# Patient Record
Sex: Female | Born: 1975 | Race: White | Hispanic: No | Smoking: Current every day smoker
Health system: Southern US, Community
[De-identification: ages and names within clinical notes are randomized; demographics above are authoritative.]

## PROBLEM LIST (undated history)

## (undated) DIAGNOSIS — N939 Abnormal uterine and vaginal bleeding, unspecified: Secondary | ICD-10-CM

## (undated) DIAGNOSIS — Z789 Other specified health status: Secondary | ICD-10-CM

## (undated) DIAGNOSIS — N83209 Unspecified ovarian cyst, unspecified side: Secondary | ICD-10-CM

## (undated) HISTORY — DX: Unspecified ovarian cyst, unspecified side: N83.209

## (undated) HISTORY — DX: Other specified health status: Z78.9

## (undated) HISTORY — PX: WISDOM TOOTH EXTRACTION: SHX21

## (undated) HISTORY — PX: DILATION AND CURETTAGE OF UTERUS: SHX78

## (undated) HISTORY — DX: Abnormal uterine and vaginal bleeding, unspecified: N93.9

---

## 2007-06-09 ENCOUNTER — Inpatient Hospital Stay: Payer: Self-pay | Admitting: Unknown Physician Specialty

## 2007-08-21 ENCOUNTER — Ambulatory Visit: Payer: Self-pay | Admitting: Unknown Physician Specialty

## 2007-08-22 ENCOUNTER — Ambulatory Visit: Payer: Self-pay | Admitting: Unknown Physician Specialty

## 2009-04-08 ENCOUNTER — Inpatient Hospital Stay: Payer: Self-pay

## 2021-06-15 ENCOUNTER — Ambulatory Visit: Payer: Medicaid Other | Admitting: Family Medicine

## 2021-06-15 ENCOUNTER — Encounter: Payer: Self-pay | Admitting: Family Medicine

## 2021-06-15 VITALS — BP 130/90 | Ht 69.0 in | Wt 201.0 lb

## 2021-06-15 DIAGNOSIS — N939 Abnormal uterine and vaginal bleeding, unspecified: Secondary | ICD-10-CM | POA: Diagnosis not present

## 2021-06-15 DIAGNOSIS — T8332XA Displacement of intrauterine contraceptive device, initial encounter: Secondary | ICD-10-CM | POA: Diagnosis not present

## 2021-06-15 MED ORDER — MEGESTROL ACETATE 40 MG PO TABS: ORAL_TABLET | ORAL | 3 refills | Status: DC

## 2021-06-15 NOTE — Progress Notes (Signed)
? ?  AUB VISIT ENCOUNTER NOTE ? ?Subjective:  ? Heather Mccann is a 46 y.o. [id number] female here for reproductive life counseling. The patient is currently using IUD or IUS to prevent pregnancy.   ? ?The patient does not want a pregnancy in the next year.   ? ?Client states they are looking for the following:  Cycle control. Reports she had AUB for months since IUD placement. The IUD placed to help with AUB but has not.  ? ?Denies abnormal  discharge, pelvic pain, problems with intercourse or other gynecologic concerns.  ?  ?Gynecologic History ?No LMP recorded (lmp unknown). ? ?Health Maintenance Due  ?Topic Date Due  ? COVID-19 Vaccine (1) Never done  ? HIV Screening  Never done  ? Hepatitis C Screening  Never done  ? TETANUS/TDAP  Never done  ? PAP SMEAR-Modifier  Never done  ? ? ? ?The following portions of the patient's history were reviewed and updated as appropriate: allergies, current medications, past family history, past medical history, past social history, past surgical history and problem list. ? ?Review of Systems ?Pertinent items are noted in HPI. ?  ?Objective:  ?BP 130/90   Ht 5\' 9"  (1.753 m)   Wt 201 lb (91.2 kg)   LMP  (LMP Unknown)   BMI 29.68 kg/m?  ?Gen: well appearing, NAD ?HEENT: no scleral icterus ?CV: RR ?Lung: Normal WOB ?Ext: warm well perfused ? ?Assessment and Plan:  ? ?1. Abnormal uterine bleeding (AUB) ?Currently on cycle ?Discussed bleeding continue and then with follow up with GYN surgeon to discuss ablation. Patient also need TVUS to assess for fibroids in terms of operative planning.  ?Patient is not a good candidate for estrogen containing methods due to heavy smoking and age > 66 ?After discussion about plan of care patient wants to keep IUD until 33 and appt with GYn surgeon. There might be the possibility of removal at time of ablation.  ?- megestrol (MEGACE) 40 MG tablet; Take two tablets three times daily for 3 days, then two tablets twice daily for 3 days, then one  tablet twice daily  Dispense: 90 tablet; Refill: 3 ?- US PELVIS (TRANSABDOMINAL ONLY); Future ? ? ?Please refer to After Visit Summary for other counseling recommendations.  ? ?Return in about 4 weeks (around 07/13/2021) for with GYN surgeon -- discuss ablation/pre-op and 07/15/2021 results. ? ?Future Appointments  ?Date Time Provider Department Center  ?06/24/2021  3:30 PM [facility]  ?07/15/2021  8:55 AM Constant, 07/17/2021, MD [facility] None  ? ? ?Gigi Gin, MD ? ?

## 2021-06-19 ENCOUNTER — Encounter: Payer: Self-pay | Admitting: Family Medicine

## 2021-06-24 ENCOUNTER — Ambulatory Visit
Admission: RE | Admit: 2021-06-24 | Discharge: 2021-06-24 | Disposition: A | Discharge status: Home / Self Care | Payer: Medicaid Other | Source: Ambulatory Visit | Attending: Family Medicine | Admitting: Family Medicine

## 2021-06-24 DIAGNOSIS — N939 Abnormal uterine and vaginal bleeding, unspecified: Secondary | ICD-10-CM | POA: Diagnosis present

## 2021-06-26 ENCOUNTER — Encounter: Payer: Self-pay | Admitting: Family Medicine

## 2021-06-26 MED ORDER — SLYND 4 MG PO TABS: 1.0000 | ORAL_TABLET | Freq: Every day | ORAL | 11 refills | Status: DC

## 2021-06-26 NOTE — Addendum Note (Signed)
Addended by: Geanie Berlin on: 06/26/2021 01:24 PM ? ? Modules accepted: Orders ? ?

## 2021-07-08 ENCOUNTER — Ambulatory Visit
Admission: RE | Admit: 2021-07-08 | Discharge: 2021-07-08 | Disposition: A | Discharge status: Home / Self Care | Payer: Medicaid Other | Source: Ambulatory Visit | Attending: Family Medicine | Admitting: Family Medicine

## 2021-07-08 DIAGNOSIS — T8332XA Displacement of intrauterine contraceptive device, initial encounter: Secondary | ICD-10-CM | POA: Diagnosis present

## 2021-07-08 DIAGNOSIS — N939 Abnormal uterine and vaginal bleeding, unspecified: Secondary | ICD-10-CM | POA: Insufficient documentation

## 2021-07-15 ENCOUNTER — Ambulatory Visit: Payer: Medicaid Other | Admitting: Obstetrics and Gynecology

## 2021-07-15 ENCOUNTER — Encounter: Payer: Self-pay | Admitting: Obstetrics and Gynecology

## 2021-07-15 VITALS — BP 126/80 | Ht 69.0 in | Wt 194.0 lb

## 2021-07-15 DIAGNOSIS — N920 Excessive and frequent menstruation with regular cycle: Secondary | ICD-10-CM | POA: Diagnosis not present

## 2021-07-15 NOTE — Progress Notes (Signed)
46 yo P2 with BMI 28 following up for management of menorrhagia. Patient reports a monthly period lasting 7 days associated with heavy flow and passage of clots. She had a Mirena IUD placed 1 years ago and reports no improvement in her vaginal bleeding. Patient had a pelvic ultrasound and abdominal x-ray demonstrating the absence of an IUD. Patient reports improvement in her vaginal bleeding following megace taper and initiation of slynd birth control. Patient presents today wanting to discuss endometrial ablation.   No past medical history on file. No past surgical history on file. No family history on file. Social History   Tobacco Use   Smoking status: Every Day    Types: Cigarettes   Smokeless tobacco: Never  Vaping Use   Vaping Use: Never used  Substance Use Topics   Alcohol use: Never   Drug use: Never   ROS See pertinent in HPI. All other systems reviewed and non contributory  Blood pressure 126/80, height 5\' 9"  (1.753 m), weight 194 lb (88 kg). GENERAL: Well-developed, well-nourished female in no acute distress.  NEURO: alert and oriented x 3  DG Abd 1 View  Result Date: 07/08/2021 CLINICAL DATA:  Missing IUD EXAM: ABDOMEN - 1 VIEW COMPARISON:  None Available. FINDINGS: No IUD identified. The bowel gas pattern is normal. No radio-opaque calculi or other significant radiographic abnormality are seen. IMPRESSION: No IUD identified. Electronically Signed   By: 07/10/2021 M.D.   On: 07/08/2021 12:49   07/10/2021 PELVIC COMPLETE WITH TRANSVAGINAL  Result Date: 06/25/2021 CLINICAL DATA:  Initial evaluation for abnormal uterine bleeding. IUD placement. EXAM: TRANSABDOMINAL AND TRANSVAGINAL ULTRASOUND OF PELVIS TECHNIQUE: Both transabdominal and transvaginal ultrasound examinations of the pelvis were performed. Transabdominal technique was performed for global imaging of the pelvis including uterus, ovaries, adnexal regions, and pelvic cul-de-sac. It was necessary to proceed with  endovaginal exam following the transabdominal exam to visualize the endometrium and ovaries. COMPARISON:  None available. FINDINGS: Uterus Measurements: 9.4 x 5.7 x 6.3 cm = volume: 178.4 mL. Uterus is anteverted. Heterogeneous echotexture seen throughout the uterine myometrium with a few scattered subcentimeter myometrial cyst, which could reflect adenomyosis. No discrete fibroid. Endometrium Thickened up to 19.3 mm. There is a superimposed polypoid echogenic lesion measuring 2.2 x 1.3 x 3.5 cm (image 35). Suggestion of associated vascularity (image 36). Trace simple fluid noted within the adjacent endometrial cavity. No IUD is seen within the endometrial cavity. Right ovary Measurements: 2.9 x 1.3 x 1.4 cm = volume: 2.8 mL. Normal appearance/no adnexal mass. Left ovary Measurements: 2.8 x 1.2 x 2.2 cm = volume: 3.9 mL. Normal appearance/no adnexal mass. Other findings No abnormal free fluid. IMPRESSION: 1. Abnormally thickened endometrium measuring up to 19.3 mm, with superimposed 2.2 x 1.3 x 3.5 cm polypoid echogenic lesion. Finding raises the possibility for an endometrial polyp or other mass. Consider further evaluation with sonohysterogram for confirmation prior to hysteroscopy. Endometrial sampling should also be considered if patient is at high risk for endometrial carcinoma. (Ref: Radiological Reasoning: Algorithmic Workup of Abnormal Vaginal Bleeding with Endovaginal Sonography and Sonohysterography. AJR 200806-10-1999). 2. No IUD seen within the endometrial cavity. 3. Suspected underlying uterine adenomyosis.  No discrete fibroids. 4. Normal sonographic appearance of the ovaries. No adnexal mass or free fluid. Electronically Signed   By: ; [id number] M.D.   On: 06/25/2021 04:26     A/P 46 yo P2 with menorrhagia with regular cycles - Continue Slynd - Discussed risks and benefits of endometrial ablation with goal being  to improve bleeding pattern and not necessarily amenorrhea - Patient  understands that she will be scheduled with Dr. Logan Bores or Dr. Valentino Saxon for surgical management

## 2021-07-20 NOTE — Telephone Encounter (Signed)
It looks like this patient was seen for ultrasound and follow up. I am not sure what we need to do from here?

## 2021-07-21 ENCOUNTER — Encounter: Payer: Self-pay | Admitting: Family Medicine

## 2021-07-22 ENCOUNTER — Other Ambulatory Visit: Payer: Self-pay | Admitting: Obstetrics and Gynecology

## 2021-07-22 DIAGNOSIS — N920 Excessive and frequent menstruation with regular cycle: Secondary | ICD-10-CM

## 2021-07-29 ENCOUNTER — Ambulatory Visit: Payer: Medicaid Other | Admitting: Family Medicine

## 2021-08-07 ENCOUNTER — Ambulatory Visit: Payer: Medicaid Other | Admitting: Obstetrics and Gynecology

## 2021-08-24 NOTE — Progress Notes (Unsigned)
    GYNECOLOGY PROGRESS NOTE  Subjective:    Patient ID: Phineas Inches, female    DOB: 1975/06/04, 46 y.o.   MRN: [id number]  HPI  Patient is a 46 y.o. [id number] female who presents for consultation for endometrial ablation.  {Common ambulatory SmartLinks:19316}  Review of Systems {ros; complete:30496}   Objective:   There were no vitals taken for this visit. There is no height or weight on file to calculate BMI. General appearance: {general exam:16600} Abdomen: {abdominal exam:16834} Pelvic: {pelvic exam:16852::"cervix normal in appearance","external genitalia normal","no adnexal masses or tenderness","no cervical motion tenderness","rectovaginal septum normal","uterus normal size, shape, and consistency","vagina normal without discharge"} Extremities: {extremity exam:5109} Neurologic: {neuro exam:17854}   Assessment:   No diagnosis found.   Plan:   There are no diagnoses linked to this encounter.    Hildred Laser, MD [facility]

## 2021-08-25 ENCOUNTER — Encounter: Payer: Self-pay | Admitting: Family Medicine

## 2021-08-25 ENCOUNTER — Ambulatory Visit: Payer: Medicaid Other | Admitting: Obstetrics and Gynecology

## 2021-08-25 ENCOUNTER — Encounter: Payer: Self-pay | Admitting: Obstetrics and Gynecology

## 2021-08-25 VITALS — BP 119/89 | HR 107 | Resp 16 | Ht 68.5 in | Wt 193.7 lb

## 2021-08-25 DIAGNOSIS — N840 Polyp of corpus uteri: Secondary | ICD-10-CM | POA: Diagnosis not present

## 2021-08-25 DIAGNOSIS — N920 Excessive and frequent menstruation with regular cycle: Secondary | ICD-10-CM

## 2021-08-25 DIAGNOSIS — N8003 Adenomyosis of the uterus: Secondary | ICD-10-CM

## 2021-08-25 DIAGNOSIS — N939 Abnormal uterine and vaginal bleeding, unspecified: Secondary | ICD-10-CM

## 2021-08-27 MED ORDER — SLYND 4 MG PO TABS: 1.0000 | ORAL_TABLET | Freq: Every day | ORAL | 0 refills | Status: DC

## 2021-08-27 NOTE — Telephone Encounter (Signed)
Medication Samples have been provided to the patient.  Drug name: Slynd       Strength: 4 mg        Qty: 28  LOT: [id number]  Exp.Date: 08/15/2022  Dosing instructions: 1 po qd  The patient has been instructed regarding the correct time, dose, and frequency of taking this medication, including desired effects and most common side effects.   [employer] 4:26 PM 08/27/2021

## 2021-09-01 ENCOUNTER — Encounter: Payer: Self-pay | Admitting: Obstetrics and Gynecology

## 2021-09-18 ENCOUNTER — Other Ambulatory Visit: Payer: Medicaid Other

## 2021-09-21 ENCOUNTER — Other Ambulatory Visit: Payer: Self-pay

## 2021-09-21 ENCOUNTER — Encounter
Admission: RE | Admit: 2021-09-21 | Discharge: 2021-09-21 | Disposition: A | Discharge status: Home / Self Care | Payer: Medicaid Other | Source: Ambulatory Visit | Attending: Obstetrics and Gynecology | Admitting: Obstetrics and Gynecology

## 2021-09-21 ENCOUNTER — Encounter: Payer: Self-pay | Admitting: Urgent Care

## 2021-09-21 DIAGNOSIS — Z01812 Encounter for preprocedural laboratory examination: Secondary | ICD-10-CM

## 2021-09-21 LAB — CBC
HCT: 39.9 % (ref 36.0–46.0)
Hemoglobin: 12.8 g/dL (ref 12.0–15.0)
MCH: 25.2 pg — ABNORMAL LOW (ref 26.0–34.0)
MCHC: 32.1 g/dL (ref 30.0–36.0)
MCV: 78.5 fL — ABNORMAL LOW (ref 80.0–100.0)
Platelets: 318 10*3/uL (ref 150–400)
RBC: 5.08 MIL/uL (ref 3.87–5.11)
RDW: 19.4 % — ABNORMAL HIGH (ref 11.5–15.5)
WBC: 7 10*3/uL (ref 4.0–10.5)
nRBC: 0 % (ref 0.0–0.2)

## 2021-09-21 LAB — TYPE AND SCREEN
ABO/RH(D): B POS
Antibody Screen: NEGATIVE

## 2021-09-21 NOTE — Patient Instructions (Addendum)
Your procedure is scheduled on: 09/28/21 - Monday Report to the Registration Desk on the 1st floor of the [facility]. To find out your arrival time, please call [phone] between 1PM - 3PM on: 09/25/21 - Friday If your arrival time is 6:00 am, do not arrive prior to that time as the [facility] entrance doors do not open until 6:00 am.  REMEMBER: Instructions that are not followed completely may result in serious medical risk, up to and including death; or upon the discretion of your surgeon and anesthesiologist your surgery may need to be rescheduled.  Do not eat food after midnight the night before surgery.  No gum chewing, lozengers or hard candies.  You may however, drink CLEAR liquids up to 2 hours before you are scheduled to arrive for your surgery. Do not drink anything within 2 hours of your scheduled arrival time.  Clear liquids include: - water  - apple juice without pulp - gatorade (not RED colors) - black coffee or tea (Do NOT add milk or creamers to the coffee or tea) Do NOT drink anything that is not on this list.  TAKE THESE MEDICATIONS THE MORNING OF SURGERY WITH A SIP OF WATER: NONE  One week prior to surgery: Stop Anti-inflammatories (NSAIDS) such as Advil, Aleve, Ibuprofen, Motrin, Naproxen, Naprosyn and Aspirin based products such as Excedrin, Goodys Powder, BC Powder.  Stop ANY OVER THE COUNTER supplements until after surgery.  You may however, continue to take Tylenol if needed for pain up until the day of surgery.  No Alcohol for 24 hours before or after surgery.  No Smoking including e-cigarettes for 24 hours prior to surgery.  No chewable tobacco products for at least 6 hours prior to surgery.  No nicotine patches on the day of surgery.  Do not use any "recreational" drugs for at least a week prior to your surgery.  Please be advised that the combination of cocaine and anesthesia may have negative outcomes, up to and including death. If you test  positive for cocaine, your surgery will be cancelled.  On the morning of surgery brush your teeth with toothpaste and water, you may rinse your mouth with mouthwash if you wish. Do not swallow any toothpaste or mouthwash.  Do not wear jewelry, make-up, hairpins, clips or nail polish.  Do not wear lotions, powders, or perfumes.   Do not shave body from the neck down 48 hours prior to surgery just in case you cut yourself which could leave a site for infection.  Also, freshly shaved skin may become irritated if using the CHG soap.  Contact lenses, hearing aids and dentures may not be worn into surgery.  Do not bring valuables to the hospital. [facility] is not responsible for any missing/lost belongings or valuables.   Notify your doctor if there is any change in your medical condition (cold, fever, infection).  Wear comfortable clothing (specific to your surgery type) to the hospital.  After surgery, you can help prevent lung complications by doing breathing exercises.  Take deep breaths and cough every 1-2 hours. Your doctor may order a device called an Incentive Spirometer to help you take deep breaths. When coughing or sneezing, hold a pillow firmly against your incision with both hands. This is called "splinting." Doing this helps protect your incision. It also decreases belly discomfort.  If you are being admitted to the hospital overnight, leave your suitcase in the car. After surgery it may be brought to your room.  If  you are being discharged the day of surgery, you will not be allowed to drive home. You will need a responsible adult (18 years or older) to drive you home and stay with you that night.   If you are taking public transportation, you will need to have a responsible adult (18 years or older) with you. Please confirm with your physician that it is acceptable to use public transportation.   Please call the [place] [facility] at [phone] if you have  any questions about these instructions.  Surgery Visitation Policy:  Patients undergoing a surgery or procedure may have two family members or support persons with them as long as the person is not COVID-19 positive or experiencing its symptoms.   Inpatient Visitation:    Visiting hours are 7 a.m. to 8 p.m. Up to four visitors are allowed at one time in a patient room, including children. The visitors may rotate out with other people during the day. One designated support person (adult) may remain overnight.

## 2021-09-22 ENCOUNTER — Other Ambulatory Visit (HOSPITAL_COMMUNITY)
Admission: RE | Admit: 2021-09-22 | Discharge: 2021-09-22 | Disposition: A | Discharge status: Home / Self Care | Payer: Medicaid Other | Source: Ambulatory Visit | Attending: Obstetrics and Gynecology | Admitting: Obstetrics and Gynecology

## 2021-09-22 ENCOUNTER — Ambulatory Visit: Payer: Medicaid Other | Admitting: Obstetrics and Gynecology

## 2021-09-22 ENCOUNTER — Encounter: Payer: Self-pay | Admitting: Obstetrics and Gynecology

## 2021-09-22 VITALS — BP 118/78 | HR 88 | Resp 16 | Ht 69.0 in | Wt 194.0 lb

## 2021-09-22 DIAGNOSIS — N939 Abnormal uterine and vaginal bleeding, unspecified: Secondary | ICD-10-CM | POA: Insufficient documentation

## 2021-09-22 DIAGNOSIS — N840 Polyp of corpus uteri: Secondary | ICD-10-CM

## 2021-09-22 DIAGNOSIS — N8003 Adenomyosis of the uterus: Secondary | ICD-10-CM

## 2021-09-22 DIAGNOSIS — Z01818 Encounter for other preprocedural examination: Secondary | ICD-10-CM

## 2021-09-22 NOTE — H&P (View-Only) (Signed)
GYNECOLOGY PREOPERATIVE HISTORY AND PHYSICAL   Subjective:  Heather Mccann is a 46 y.o. [id number] here for surgical management of abnormal uterine bleeding, endometrial polyp, and suspected adenomyosis (ultrasound finding). Previously had IUD in place but IUD lost (suspected expulsion) during an episode of heavy vaginal bleeding.  Has been managed on Slynd and Megace for the past 4 months. No significant preoperative concerns.  Proposed surgery: Hysteroscopy D&C, polypectomy, endometrial ablation ([place]).    Pertinent Gynecological History: Menses: Prior to progestin therapy flow was heavy, 7-10 days, occurring q 28 days Bleeding: dysfunctional uterine bleeding Contraception: oral progesterone-only contraceptive Last mammogram:  patient has not had one   Last [facility]: [facility]    History reviewed. No pertinent past medical history.   Past Surgical History:  Procedure Laterality Date   DILATION AND CURETTAGE OF UTERUS     WISDOM TOOTH EXTRACTION      OB History  Gravida Para Term Preterm AB Living  3 2 2   1 2   SAB IAB Ectopic Multiple Live Births  1            # Outcome Date GA Lbr Len/2nd Weight Sex Delivery Anes PTL Lv  3 SAB           2 Term           1 Term             History reviewed. No pertinent family history.   Social History   Socioeconomic History   Marital status: Divorced    Spouse name: Not on file   Number of children: 2   Years of education: Not on file   Highest education level: Not on file  Occupational History   Not on file  Tobacco Use   Smoking status: Every Day    Types: Cigarettes   Smokeless tobacco: Never  Vaping Use   Vaping Use: Never used  Substance and Sexual Activity   Alcohol use: Never   Drug use: Never   Sexual activity: Yes    Birth control/protection: I.U.D.  Other Topics Concern   Not on file  Social History Narrative   Not on file   Social Determinants  of Health   Financial Resource Strain: Not on file  Food Insecurity: Not on file  Transportation Needs: Not on file  Physical Activity: Not on file  Stress: Not on file  Social Connections: Not on file  Intimate Partner Violence: Not on file    Current Outpatient Medications on File Prior to Visit  Medication Sig Dispense Refill   Drospirenone (SLYND) 4 MG TABS Take 1 tablet by mouth daily. 28 tablet 0   ibuprofen (ADVIL) 200 MG tablet Take 200 mg by mouth every 6 (six) hours as needed.     megestrol (MEGACE) 40 MG tablet Take two tablets three times daily for 3 days, then two tablets twice daily for 3 days, then one tablet twice daily 90 tablet 3   No current facility-administered medications on file prior to visit.    No Known Allergies   Review of Systems Constitutional: No recent fever/chills/sweats Respiratory: No recent cough/bronchitis Cardiovascular: No chest pain Gastrointestinal: No recent nausea/vomiting/diarrhea Genitourinary: No UTI symptoms Hematologic/lymphatic:No history of coagulopathy or recent blood thinner use    Objective:   Blood pressure 118/78, pulse 88, resp. rate 16, height 5\' 9"  (1.753 m), weight 194 lb (88 kg), last menstrual period 08/12/2021. CONSTITUTIONAL: Well-developed, well-nourished  female in no acute distress.  HENT:  Normocephalic, atraumatic, External right and left ear normal. Oropharynx is clear and moist EYES: Conjunctivae and EOM are normal. Pupils are equal, round, and reactive to light. No scleral icterus.  NECK: Normal range of motion, supple, no masses SKIN: Skin is warm and dry. No rash noted. Not diaphoretic. No erythema. No pallor. NEUROLOGIC: Alert and oriented to person, place, and time. Normal reflexes, muscle tone coordination. No cranial nerve deficit noted. PSYCHIATRIC: Normal mood and affect. Normal behavior. Normal judgment and thought content. CARDIOVASCULAR: Normal heart rate noted, regular rhythm RESPIRATORY:  Effort and breath sounds normal, no problems with respiration noted ABDOMEN: Soft, nontender, nondistended. PELVIC: Deferred MUSCULOSKELETAL: Normal range of motion. No edema and no tenderness. 2+ distal pulses.    Labs: Results for orders placed or performed during the hospital encounter of 09/21/21 (from the past 336 hour(s))  CBC   Collection Time: 09/21/21  4:16 PM  Result Value Ref Range   WBC 7.0 4.0 - 10.5 K/uL   RBC 5.08 3.87 - 5.11 MIL/uL   Hemoglobin 12.8 12.0 - 15.0 g/dL   HCT 39.9 36.0 - 46.0 %   MCV 78.5 (L) 80.0 - 100.0 fL   MCH 25.2 (L) 26.0 - 34.0 pg   MCHC 32.1 30.0 - 36.0 g/dL   RDW 19.4 (H) 11.5 - 15.5 %   Platelets 318 150 - 400 K/uL   nRBC 0.0 0.0 - 0.2 %  Type and screen [facility]   Collection Time: 09/21/21  4:16 PM  Result Value Ref Range   ABO/RH(D) B POS    Antibody Screen NEG    Sample Expiration 10/05/2021,2359    Extend sample reason      NO TRANSFUSIONS OR PREGNANCY IN THE PAST 3 MONTHS Performed at [facility], [address] [place], [place] [address]      Pathology:  Endometrial biopsy pending results.   Imaging Studies: Ultrasound performed 06/25/2022 CLINICAL DATA:  Initial evaluation for abnormal uterine bleeding. IUD placement.   EXAM: TRANSABDOMINAL AND TRANSVAGINAL ULTRASOUND OF PELVIS   TECHNIQUE: Both transabdominal and transvaginal ultrasound examinations of the pelvis were performed. Transabdominal technique was performed for global imaging of the pelvis including uterus, ovaries, adnexal regions, and pelvic cul-de-sac. It was necessary to proceed with endovaginal exam following the transabdominal exam to visualize the endometrium and ovaries.   COMPARISON:  None available.   FINDINGS: Uterus   Measurements: 9.4 x 5.7 x 6.3 cm = volume: 178.4 mL. Uterus is anteverted. Heterogeneous echotexture seen throughout the uterine myometrium with a few scattered subcentimeter myometrial  cyst, which could reflect adenomyosis. No discrete fibroid.   Endometrium   Thickened up to 19.3 mm. There is a superimposed polypoid echogenic lesion measuring 2.2 x 1.3 x 3.5 cm (image 35). Suggestion of associated vascularity (image 36). Trace simple fluid noted within the adjacent endometrial cavity. No IUD is seen within the endometrial cavity.   Right ovary   Measurements: 2.9 x 1.3 x 1.4 cm = volume: 2.8 mL. Normal appearance/no adnexal mass.   Left ovary   Measurements: 2.8 x 1.2 x 2.2 cm = volume: 3.9 mL. Normal appearance/no adnexal mass.   Other findings   No abnormal free fluid.   IMPRESSION: 1. Abnormally thickened endometrium measuring up to 19.3 mm, with superimposed 2.2 x 1.3 x 3.5 cm polypoid echogenic lesion. Finding raises the possibility for an endometrial polyp or other mass. Consider further evaluation with sonohysterogram for confirmation prior to   hysteroscopy. Endometrial sampling should also be considered if patient is at high risk for endometrial carcinoma. (Ref: Radiological Reasoning: Algorithmic Workup of Abnormal Vaginal Bleeding with Endovaginal Sonography and Sonohysterography. AJR 2008; [id number]:J00-93). 2. No IUD seen within the endometrial cavity. 3. Suspected underlying uterine adenomyosis.  No discrete fibroids. 4. Normal sonographic appearance of the ovaries. No adnexal mass or free fluid.     Electronically Signed   By: Rise Mu M.D.   On: 06/25/2021 04:26   Abdominal X-ray (performed 07/08/2021) DG Abd 1 View CLINICAL DATA:  Missing IUD   EXAM: ABDOMEN - 1 VIEW   COMPARISON:  None Available.   FINDINGS: No IUD identified.   The bowel gas pattern is normal. No radio-opaque calculi or other significant radiographic abnormality are seen.   IMPRESSION: No IUD identified.   Electronically Signed   By: Jannifer Hick M.D.   On: 07/08/2021 12:49       Assessment:    1. Abnormal uterine bleeding (AUB)    2. Endometrial polyp   3. Adenomyosis   4. Preoperative exam for gynecologic surgery     Plan:   - Counseling: Procedure, risks, reasons, benefits and complications (including injury to bowel, bladder, major blood vessel, ureter, bleeding, possibility of transfusion, infection, or fistula formation) reviewed in detail. Likelihood of success in alleviating the patient's condition was discussed. Routine postoperative instructions will be reviewed with the patient and her family in detail after surgery.  The patient concurred with the proposed plan, giving informed written consent for the surgery.   Preop testing performed today. Instructions reviewed, including NPO after midnight.    Hildred Laser, MD [facility]

## 2021-09-22 NOTE — Progress Notes (Signed)
    GYNECOLOGY PROGRESS NOTE  Subjective:    Patient ID: Heather Mccann, female    DOB: 04-18-1975, 46 y.o.   MRN: [id number]  HPI  Patient is a 46 y.o. [id number] female who presents for endometrial biopsy and pre-op for surgical management of abnormal uterine bleeding, endometrial polyp, suspected adenomyosis. Patient is planning for endometrial ablation.  Currently on Slynd and Megace for management of her bleeding.   The following portions of the patient's history were reviewed and updated as appropriate: allergies, current medications, past family history, past medical history, past social history, past surgical history, and problem list.  Review of Systems Pertinent items are noted in HPI.   Objective:   Blood pressure 118/78, pulse 88, resp. rate 16, height 5\' 9"  (1.753 m), weight 194 lb (88 kg), last menstrual period 08/12/2021. Body mass index is 28.65 kg/m. General appearance: alert and no distress See H&P for remainder of exam.    Assessment:   1. Abnormal uterine bleeding (AUB)   2. Endometrial polyp   3. Adenomyosis      Plan:   1. Abnormal uterine bleeding (AUB) - Endometrial biopsy performed today (see procedure note below).  - Plans for endometrial ablation, pre-op done today.   2. Endometrial polyp - noted on ultrasound. Will perform polypectomy at time of hysteroscopic ablation.   3. Adenomyosis - Previous counseling regarding suspected diagnosis of adenomyosis and endometrial ablation as per last note:  Endometrial ablation can help with management of her bleeding however if she experiences pelvic pain or significant dysmenorrhea from her cycles, that she would still experience this and the pain could become worse after having an ablation.  I informed that some patients do go on to require hysterectomy later due to this condition or due to the pain after an ablation.  Patient reports that she does not experience dysmenorrhea, and that her biggest concern is her  bleeding.  I discussed that endometrial ablation can cause amenorrhea in approximately 75 to 80% of patients, and the remaining 15 to 20% may still have some bleeding however typically is much lighter.  Patient notes that she is fine with either of these outcomes.          Endometrial Biopsy Procedure Note  The patient is positioned on the exam table in the dorsal lithotomy position. Bimanual exam confirms uterine position and size. A Graves speculum is placed into the vagina. A single toothed tenaculum is placed onto the anterior lip of the cervix. The pipette is placed into the endocervical canal and is advanced to the uterine fundus. Using a piston like technique, with vacuum created by withdrawing the stylus, the endometrial specimen is obtained and transferred to the biopsy container. Minimal bleeding is encountered. The procedure is well tolerated.   Uterine Position:anterior   Uterine Length: 8  cm   Uterine Specimen: Scant   Post procedure instructions are given. The patient is scheduled for follow up appointment.   08/14/2021, MD [facility]

## 2021-09-22 NOTE — H&P (Signed)
GYNECOLOGY PREOPERATIVE HISTORY AND PHYSICAL   Subjective:  Heather Mccann is a 46 y.o. [id number] here for surgical management of abnormal uterine bleeding, endometrial polyp, and suspected adenomyosis (ultrasound finding). Previously had IUD in place but IUD lost (suspected expulsion) during an episode of heavy vaginal bleeding.  Has been managed on Slynd and Megace for the past 4 months. No significant preoperative concerns.  Proposed surgery: Hysteroscopy D&C, polypectomy, endometrial ablation (Minerva).    Pertinent Gynecological History: Menses: Prior to progestin therapy flow was heavy, 7-10 days, occurring q 28 days Bleeding: dysfunctional uterine bleeding Contraception: oral progesterone-only contraceptive Last mammogram:  patient has not had one   Last [facility]: [facility].    History reviewed. No pertinent past medical history.   Past Surgical History:  Procedure Laterality Date   DILATION AND CURETTAGE OF UTERUS     WISDOM TOOTH EXTRACTION      OB History  Gravida Para Term Preterm AB Living  3 2 2   1 2   SAB IAB Ectopic Multiple Live Births  1            # Outcome Date GA Lbr Len/2nd Weight Sex Delivery Anes PTL Lv  3 SAB           2 Term           1 Term             History reviewed. No pertinent family history.   Social History   Socioeconomic History   Marital status: Divorced    Spouse name: Not on file   Number of children: 2   Years of education: Not on file   Highest education level: Not on file  Occupational History   Not on file  Tobacco Use   Smoking status: Every Day    Types: Cigarettes   Smokeless tobacco: Never  Vaping Use   Vaping Use: Never used  Substance and Sexual Activity   Alcohol use: Never   Drug use: Never   Sexual activity: Yes    Birth control/protection: I.U.D.  Other Topics Concern   Not on file  Social History Narrative   Not on file   Social Determinants  of Health   Financial Resource Strain: Not on file  Food Insecurity: Not on file  Transportation Needs: Not on file  Physical Activity: Not on file  Stress: Not on file  Social Connections: Not on file  Intimate Partner Violence: Not on file    Current Outpatient Medications on File Prior to Visit  Medication Sig Dispense Refill   Drospirenone (SLYND) 4 MG TABS Take 1 tablet by mouth daily. 28 tablet 0   ibuprofen (ADVIL) 200 MG tablet Take 200 mg by mouth every 6 (six) hours as needed.     megestrol (MEGACE) 40 MG tablet Take two tablets three times daily for 3 days, then two tablets twice daily for 3 days, then one tablet twice daily 90 tablet 3   No current facility-administered medications on file prior to visit.    No Known Allergies   Review of Systems Constitutional: No recent fever/chills/sweats Respiratory: No recent cough/bronchitis Cardiovascular: No chest pain Gastrointestinal: No recent nausea/vomiting/diarrhea Genitourinary: No UTI symptoms Hematologic/lymphatic:No history of coagulopathy or recent blood thinner use    Objective:   Blood pressure 118/78, pulse 88, resp. rate 16, height 5\' 9"  (1.753 m), weight 194 lb (88 kg), last menstrual period 08/12/2021. CONSTITUTIONAL: Well-developed, well-nourished  female in no acute distress.  HENT:  Normocephalic, atraumatic, External right and left ear normal. Oropharynx is clear and moist EYES: Conjunctivae and EOM are normal. Pupils are equal, round, and reactive to light. No scleral icterus.  NECK: Normal range of motion, supple, no masses SKIN: Skin is warm and dry. No rash noted. Not diaphoretic. No erythema. No pallor. NEUROLOGIC: Alert and oriented to person, place, and time. Normal reflexes, muscle tone coordination. No cranial nerve deficit noted. PSYCHIATRIC: Normal mood and affect. Normal behavior. Normal judgment and thought content. CARDIOVASCULAR: Normal heart rate noted, regular rhythm RESPIRATORY:  Effort and breath sounds normal, no problems with respiration noted ABDOMEN: Soft, nontender, nondistended. PELVIC: Deferred MUSCULOSKELETAL: Normal range of motion. No edema and no tenderness. 2+ distal pulses.    Labs: Results for orders placed or performed during the hospital encounter of 09/21/21 (from the past 336 hour(s))  CBC   Collection Time: 09/21/21  4:16 PM  Result Value Ref Range   WBC 7.0 4.0 - 10.5 K/uL   RBC 5.08 3.87 - 5.11 MIL/uL   Hemoglobin 12.8 12.0 - 15.0 g/dL   HCT 39.9 36.0 - 46.0 %   MCV 78.5 (L) 80.0 - 100.0 fL   MCH 25.2 (L) 26.0 - 34.0 pg   MCHC 32.1 30.0 - 36.0 g/dL   RDW 19.4 (H) 11.5 - 15.5 %   Platelets 318 150 - 400 K/uL   nRBC 0.0 0.0 - 0.2 %  Type and screen [facility]   Collection Time: 09/21/21  4:16 PM  Result Value Ref Range   ABO/RH(D) B POS    Antibody Screen NEG    Sample Expiration 10/05/2021,2359    Extend sample reason      NO TRANSFUSIONS OR PREGNANCY IN THE PAST 3 MONTHS Performed at [facility], [address] [place], [place] [address]      Pathology:  Endometrial biopsy pending results.   Imaging Studies: Ultrasound performed 06/25/2022 CLINICAL DATA:  Initial evaluation for abnormal uterine bleeding. IUD placement.   EXAM: TRANSABDOMINAL AND TRANSVAGINAL ULTRASOUND OF PELVIS   TECHNIQUE: Both transabdominal and transvaginal ultrasound examinations of the pelvis were performed. Transabdominal technique was performed for global imaging of the pelvis including uterus, ovaries, adnexal regions, and pelvic cul-de-sac. It was necessary to proceed with endovaginal exam following the transabdominal exam to visualize the endometrium and ovaries.   COMPARISON:  None available.   FINDINGS: Uterus   Measurements: 9.4 x 5.7 x 6.3 cm = volume: 178.4 mL. Uterus is anteverted. Heterogeneous echotexture seen throughout the uterine myometrium with a few scattered subcentimeter myometrial  cyst, which could reflect adenomyosis. No discrete fibroid.   Endometrium   Thickened up to 19.3 mm. There is a superimposed polypoid echogenic lesion measuring 2.2 x 1.3 x 3.5 cm (image 35). Suggestion of associated vascularity (image 36). Trace simple fluid noted within the adjacent endometrial cavity. No IUD is seen within the endometrial cavity.   Right ovary   Measurements: 2.9 x 1.3 x 1.4 cm = volume: 2.8 mL. Normal appearance/no adnexal mass.   Left ovary   Measurements: 2.8 x 1.2 x 2.2 cm = volume: 3.9 mL. Normal appearance/no adnexal mass.   Other findings   No abnormal free fluid.   IMPRESSION: 1. Abnormally thickened endometrium measuring up to 19.3 mm, with superimposed 2.2 x 1.3 x 3.5 cm polypoid echogenic lesion. Finding raises the possibility for an endometrial polyp or other mass. Consider further evaluation with sonohysterogram for confirmation prior to  hysteroscopy. Endometrial sampling should also be considered if patient is at high risk for endometrial carcinoma. (Ref: Radiological Reasoning: Algorithmic Workup of Abnormal Vaginal Bleeding with Endovaginal Sonography and Sonohysterography. AJR 2008; 703:J00-93). 2. No IUD seen within the endometrial cavity. 3. Suspected underlying uterine adenomyosis.  No discrete fibroids. 4. Normal sonographic appearance of the ovaries. No adnexal mass or free fluid.     Electronically Signed   By: Rise Mu M.D.   On: 06/25/2021 04:26   Abdominal X-ray (performed 07/08/2021) DG Abd 1 View CLINICAL DATA:  Missing IUD   EXAM: ABDOMEN - 1 VIEW   COMPARISON:  None Available.   FINDINGS: No IUD identified.   The bowel gas pattern is normal. No radio-opaque calculi or other significant radiographic abnormality are seen.   IMPRESSION: No IUD identified.   Electronically Signed   By: Jannifer Hick M.D.   On: 07/08/2021 12:49       Assessment:    1. Abnormal uterine bleeding (AUB)    2. Endometrial polyp   3. Adenomyosis   4. Preoperative exam for gynecologic surgery     Plan:   - Counseling: Procedure, risks, reasons, benefits and complications (including injury to bowel, bladder, major blood vessel, ureter, bleeding, possibility of transfusion, infection, or fistula formation) reviewed in detail. Likelihood of success in alleviating the patient's condition was discussed. Routine postoperative instructions will be reviewed with the patient and her family in detail after surgery.  The patient concurred with the proposed plan, giving informed written consent for the surgery.   Preop testing performed today. Instructions reviewed, including NPO after midnight.    Hildred Laser, MD [facility]

## 2021-09-23 LAB — SURGICAL PATHOLOGY

## 2021-09-25 ENCOUNTER — Encounter: Payer: Self-pay | Admitting: Family Medicine

## 2021-09-28 ENCOUNTER — Encounter
Admission: RE | Disposition: A | Discharge status: Home / Self Care | Payer: Self-pay | Source: Ambulatory Visit | Attending: Obstetrics and Gynecology

## 2021-09-28 ENCOUNTER — Ambulatory Visit
Admission: RE | Admit: 2021-09-28 | Discharge: 2021-09-28 | Disposition: A | Discharge status: Home / Self Care | Payer: Medicaid Other | Source: Ambulatory Visit | Attending: Obstetrics and Gynecology | Admitting: Obstetrics and Gynecology

## 2021-09-28 ENCOUNTER — Ambulatory Visit: Payer: Medicaid Other | Admitting: Registered Nurse

## 2021-09-28 ENCOUNTER — Encounter: Payer: Self-pay | Admitting: Obstetrics and Gynecology

## 2021-09-28 ENCOUNTER — Ambulatory Visit: Payer: Medicaid Other | Admitting: Urgent Care

## 2021-09-28 ENCOUNTER — Other Ambulatory Visit: Payer: Self-pay

## 2021-09-28 DIAGNOSIS — N8003 Adenomyosis of the uterus: Secondary | ICD-10-CM

## 2021-09-28 DIAGNOSIS — F17210 Nicotine dependence, cigarettes, uncomplicated: Secondary | ICD-10-CM | POA: Insufficient documentation

## 2021-09-28 DIAGNOSIS — Z01818 Encounter for other preprocedural examination: Secondary | ICD-10-CM

## 2021-09-28 DIAGNOSIS — N938 Other specified abnormal uterine and vaginal bleeding: Secondary | ICD-10-CM | POA: Insufficient documentation

## 2021-09-28 DIAGNOSIS — Z01812 Encounter for preprocedural laboratory examination: Secondary | ICD-10-CM

## 2021-09-28 DIAGNOSIS — N840 Polyp of corpus uteri: Secondary | ICD-10-CM | POA: Diagnosis not present

## 2021-09-28 DIAGNOSIS — Z9889 Other specified postprocedural states: Secondary | ICD-10-CM

## 2021-09-28 HISTORY — PX: POLYPECTOMY: SHX5525

## 2021-09-28 HISTORY — PX: DILATATION AND CURETTAGE/HYSTEROSCOPY WITH MINERVA: SHX6851

## 2021-09-28 LAB — CBC
HCT: 42.2 % (ref 36.0–46.0)
Hemoglobin: 13.5 g/dL (ref 12.0–15.0)
MCH: 25.4 pg — ABNORMAL LOW (ref 26.0–34.0)
MCHC: 32 g/dL (ref 30.0–36.0)
MCV: 79.3 fL — ABNORMAL LOW (ref 80.0–100.0)
Platelets: 313 10*3/uL (ref 150–400)
RBC: 5.32 MIL/uL — ABNORMAL HIGH (ref 3.87–5.11)
RDW: 19.2 % — ABNORMAL HIGH (ref 11.5–15.5)
WBC: 6.7 10*3/uL (ref 4.0–10.5)
nRBC: 0 % (ref 0.0–0.2)

## 2021-09-28 LAB — ABO/RH: ABO/RH(D): B POS

## 2021-09-28 LAB — POCT PREGNANCY, URINE: Preg Test, Ur: NEGATIVE

## 2021-09-28 SURGERY — DILATATION AND CURETTAGE/HYSTEROSCOPY WITH MINERVA
Anesthesia: General

## 2021-09-28 MED ORDER — OXYCODONE HCL 5 MG/5ML PO SOLN
5.0000 mg | Freq: Once | ORAL | Status: AC | PRN
  Administered 2021-09-28: 5 mg via ORAL

## 2021-09-28 MED ORDER — MIDAZOLAM HCL 2 MG/2ML IJ SOLN
INTRAMUSCULAR | Status: AC
  Filled 2021-09-28: qty 2

## 2021-09-28 MED ORDER — ORAL CARE MOUTH RINSE: 15.0000 mL | Freq: Once | OROMUCOSAL | Status: AC

## 2021-09-28 MED ORDER — FAMOTIDINE 20 MG PO TABS
ORAL_TABLET | ORAL | Status: AC
  Administered 2021-09-28: 20 mg via ORAL
  Filled 2021-09-28: qty 1

## 2021-09-28 MED ORDER — IBUPROFEN 600 MG PO TABS: 600.0000 mg | ORAL_TABLET | Freq: Four times a day (QID) | ORAL | 1 refills | Status: DC | PRN

## 2021-09-28 MED ORDER — CHLORHEXIDINE GLUCONATE 0.12 % MT SOLN
OROMUCOSAL | Status: AC
  Administered 2021-09-28: 15 mL via OROMUCOSAL
  Filled 2021-09-28: qty 15

## 2021-09-28 MED ORDER — PROPOFOL 10 MG/ML IV BOLUS
INTRAVENOUS | Status: AC
  Filled 2021-09-28: qty 20

## 2021-09-28 MED ORDER — LIDOCAINE HCL (CARDIAC) PF 100 MG/5ML IV SOSY
PREFILLED_SYRINGE | INTRAVENOUS | Status: DC | PRN
  Administered 2021-09-28: 100 mg via INTRAVENOUS

## 2021-09-28 MED ORDER — LACTATED RINGERS IV SOLN: INTRAVENOUS | Status: DC

## 2021-09-28 MED ORDER — GABAPENTIN 300 MG PO CAPS: 300.0000 mg | ORAL_CAPSULE | ORAL | Status: AC

## 2021-09-28 MED ORDER — DEXAMETHASONE SODIUM PHOSPHATE 10 MG/ML IJ SOLN
INTRAMUSCULAR | Status: DC | PRN
  Administered 2021-09-28: 4 mg via INTRAVENOUS

## 2021-09-28 MED ORDER — 0.9 % SODIUM CHLORIDE (POUR BTL) OPTIME
TOPICAL | Status: DC | PRN
  Administered 2021-09-28: 1700 mL

## 2021-09-28 MED ORDER — BUPIVACAINE HCL (PF) 0.5 % IJ SOLN
INTRAMUSCULAR | Status: DC | PRN
  Administered 2021-09-28: 10 mL

## 2021-09-28 MED ORDER — EPHEDRINE 5 MG/ML INJ
INTRAVENOUS | Status: AC
  Filled 2021-09-28: qty 5

## 2021-09-28 MED ORDER — CHLORHEXIDINE GLUCONATE 0.12 % MT SOLN: 15.0000 mL | Freq: Once | OROMUCOSAL | Status: AC

## 2021-09-28 MED ORDER — OXYCODONE HCL 5 MG PO TABS
ORAL_TABLET | ORAL | Status: AC
  Filled 2021-09-28: qty 1

## 2021-09-28 MED ORDER — ONDANSETRON HCL 4 MG/2ML IJ SOLN
INTRAMUSCULAR | Status: DC | PRN
  Administered 2021-09-28: 4 mg via INTRAVENOUS

## 2021-09-28 MED ORDER — KETOROLAC TROMETHAMINE 30 MG/ML IJ SOLN
INTRAMUSCULAR | Status: AC
Start: 2021-09-28 — End: ?
  Filled 2021-09-28: qty 1

## 2021-09-28 MED ORDER — OXYCODONE HCL 5 MG PO TABS: 5.0000 mg | ORAL_TABLET | Freq: Once | ORAL | Status: AC | PRN

## 2021-09-28 MED ORDER — MIDAZOLAM HCL 2 MG/2ML IJ SOLN
INTRAMUSCULAR | Status: DC | PRN
  Administered 2021-09-28: 2 mg via INTRAVENOUS

## 2021-09-28 MED ORDER — FAMOTIDINE 20 MG PO TABS: 20.0000 mg | ORAL_TABLET | Freq: Once | ORAL | Status: AC

## 2021-09-28 MED ORDER — DEXAMETHASONE SODIUM PHOSPHATE 10 MG/ML IJ SOLN
INTRAMUSCULAR | Status: AC
  Filled 2021-09-28: qty 1

## 2021-09-28 MED ORDER — ACETAMINOPHEN 500 MG PO TABS: 1000.0000 mg | ORAL_TABLET | ORAL | Status: AC

## 2021-09-28 MED ORDER — PHENYLEPHRINE 80 MCG/ML (10ML) SYRINGE FOR IV PUSH (FOR BLOOD PRESSURE SUPPORT)
PREFILLED_SYRINGE | INTRAVENOUS | Status: AC
  Filled 2021-09-28: qty 10

## 2021-09-28 MED ORDER — FENTANYL CITRATE (PF) 100 MCG/2ML IJ SOLN: 25.0000 ug | INTRAMUSCULAR | Status: DC | PRN

## 2021-09-28 MED ORDER — GABAPENTIN 300 MG PO CAPS
ORAL_CAPSULE | ORAL | Status: AC
  Administered 2021-09-28: 300 mg via ORAL
  Filled 2021-09-28: qty 1

## 2021-09-28 MED ORDER — BUPIVACAINE HCL (PF) 0.5 % IJ SOLN
INTRAMUSCULAR | Status: AC
Start: 2021-09-28 — End: ?
  Filled 2021-09-28: qty 30

## 2021-09-28 MED ORDER — KETOROLAC TROMETHAMINE 30 MG/ML IJ SOLN
INTRAMUSCULAR | Status: DC | PRN
  Administered 2021-09-28: 30 mg via INTRAVENOUS

## 2021-09-28 MED ORDER — ONDANSETRON HCL 4 MG/2ML IJ SOLN
INTRAMUSCULAR | Status: AC
  Filled 2021-09-28: qty 2

## 2021-09-28 MED ORDER — ACETAMINOPHEN 500 MG PO TABS
ORAL_TABLET | ORAL | Status: AC
  Administered 2021-09-28: 1000 mg via ORAL
  Filled 2021-09-28: qty 2

## 2021-09-28 MED ORDER — FENTANYL CITRATE (PF) 100 MCG/2ML IJ SOLN
INTRAMUSCULAR | Status: DC | PRN
  Administered 2021-09-28 (×4): 25 ug via INTRAVENOUS

## 2021-09-28 MED ORDER — PROPOFOL 10 MG/ML IV BOLUS
INTRAVENOUS | Status: DC | PRN
  Administered 2021-09-28: 140 mg via INTRAVENOUS

## 2021-09-28 MED ORDER — FENTANYL CITRATE (PF) 100 MCG/2ML IJ SOLN
INTRAMUSCULAR | Status: AC
  Filled 2021-09-28: qty 2

## 2021-09-28 MED ORDER — EPHEDRINE SULFATE (PRESSORS) 50 MG/ML IJ SOLN
INTRAMUSCULAR | Status: DC | PRN
  Administered 2021-09-28: 5 mg via INTRAVENOUS

## 2021-09-28 MED ORDER — PHENYLEPHRINE 80 MCG/ML (10ML) SYRINGE FOR IV PUSH (FOR BLOOD PRESSURE SUPPORT)
PREFILLED_SYRINGE | INTRAVENOUS | Status: DC | PRN
  Administered 2021-09-28: 160 ug via INTRAVENOUS

## 2021-09-28 SURGICAL SUPPLY — 22 items
CANISTER SUC SOCK COL 7IN (MISCELLANEOUS) ×2 IMPLANT
DEVICE MYOSURE LITE (MISCELLANEOUS) ×1 IMPLANT
DRSG TELFA 3X8 NADH (GAUZE/BANDAGES/DRESSINGS) ×2 IMPLANT
ELECT REM PT RETURN 9FT ADLT (ELECTROSURGICAL) ×2
ELECTRODE REM PT RTRN 9FT ADLT (ELECTROSURGICAL) ×1 IMPLANT
GLOVE BIO SURGEON STRL SZ 6.5 (GLOVE) ×2 IMPLANT
GOWN STRL REUS W/ TWL LRG LVL3 (GOWN DISPOSABLE) ×2 IMPLANT
GOWN STRL REUS W/TWL LRG LVL3 (GOWN DISPOSABLE) ×2
IV NS IRRIG 3000ML ARTHROMATIC (IV SOLUTION) ×2 IMPLANT
KIT PROCEDURE FLUENT (KITS) ×1 IMPLANT
KIT TURNOVER CYSTO (KITS) ×2 IMPLANT
MANIFOLD NEPTUNE II (INSTRUMENTS) ×2 IMPLANT
PACK DNC HYST (MISCELLANEOUS) ×2 IMPLANT
PAD DRESSING TELFA 3X8 NADH (GAUZE/BANDAGES/DRESSINGS) IMPLANT
PAD OB MATERNITY 4.3X12.25 (PERSONAL CARE ITEMS) ×2 IMPLANT
PAD PREP 24X41 OB/GYN DISP (PERSONAL CARE ITEMS) ×2 IMPLANT
SCRUB CHG 4% DYNA-HEX 4OZ (MISCELLANEOUS) ×2 IMPLANT
SEAL ROD LENS SCOPE MYOSURE (ABLATOR) ×2 IMPLANT
SET CYSTO W/LG BORE CLAMP LF (SET/KITS/TRAYS/PACK) IMPLANT
TRAP FLUID SMOKE EVACUATOR (MISCELLANEOUS) ×2 IMPLANT
TUBING CONNECTING 10 (TUBING) IMPLANT
WATER STERILE IRR 500ML POUR (IV SOLUTION) ×2 IMPLANT

## 2021-09-28 NOTE — Anesthesia Preprocedure Evaluation (Signed)
Anesthesia Evaluation  Patient identified by MRN, date of birth, ID band Patient awake    Reviewed: Allergy & Precautions, NPO status , Patient's Chart, lab work & pertinent test results  History of Anesthesia Complications Negative for: history of anesthetic complications  Airway Mallampati: III  TM Distance: <3 FB Neck ROM: full    Dental  (+) Chipped   Pulmonary neg shortness of breath, Current Smoker and Patient abstained from smoking.,    Pulmonary exam normal        Cardiovascular Exercise Tolerance: Good (-) angina(-) Past MI and (-) DOE negative cardio ROS Normal cardiovascular exam     Neuro/Psych negative neurological ROS  negative psych ROS   GI/Hepatic negative GI ROS, Neg liver ROS, neg GERD  ,  Endo/Other  negative endocrine ROS  Renal/GU      Musculoskeletal   Abdominal   Peds  Hematology negative hematology ROS (+)   Anesthesia Other Findings History reviewed. No pertinent past medical history.  Past Surgical History: No date: DILATION AND CURETTAGE OF UTERUS No date: WISDOM TOOTH EXTRACTION  BMI    Body Mass Index: 28.65 kg/m      Reproductive/Obstetrics negative OB ROS                             Anesthesia Physical Anesthesia Plan  ASA: 2  Anesthesia Plan: General LMA   Post-op Pain Management:    Induction: Intravenous  PONV Risk Score and Plan: Dexamethasone, Ondansetron, Midazolam and Treatment may vary due to age or medical condition  Airway Management Planned: LMA  Additional Equipment:   Intra-op Plan:   Post-operative Plan: Extubation in OR  Informed Consent: I have reviewed the patients History and Physical, chart, labs and discussed the procedure including the risks, benefits and alternatives for the proposed anesthesia with the patient or authorized representative who has indicated his/her understanding and acceptance.     Dental  Advisory Given  Plan Discussed with: Anesthesiologist, CRNA and Surgeon  Anesthesia Plan Comments: (Patient consented for risks of anesthesia including but not limited to:  - adverse reactions to medications - damage to eyes, teeth, lips or other oral mucosa - nerve damage due to positioning  - sore throat or hoarseness - Damage to heart, brain, nerves, lungs, other parts of body or loss of life  Patient voiced understanding.)        Anesthesia Quick Evaluation

## 2021-09-28 NOTE — Transfer of Care (Signed)
Immediate Anesthesia Transfer of Care Note  Patient: Heather Mccann  Procedure(s) Performed: DILATATION AND CURETTAGE/HYSTEROSCOPY WITH MINERVA POLYPECTOMY  Patient Location: PACU  Anesthesia Type:General  Level of Consciousness: drowsy  Airway & Oxygen Therapy: Patient Spontanous Breathing and Patient connected to face mask oxygen  Post-op Assessment: Report given to RN and Post -op Vital signs reviewed and stable  Post vital signs: Reviewed and stable  Last Vitals:  Vitals Value Taken Time  BP 128/80 09/28/21 1431  Temp    Pulse 102 09/28/21 1434  Resp 32 09/28/21 1434  SpO2 93 % 09/28/21 1434  Vitals shown include unvalidated device data.  Last Pain:  Vitals:   09/28/21 1111  TempSrc: Temporal  PainSc: 0-No pain         Complications: No notable events documented.

## 2021-09-28 NOTE — Op Note (Signed)
Hysteroscopy Procedure Note  Indications: Heather Mccann is a 46 y.o. [id number] female with dysfunctional uterine bleeding, suspected adenomyosis on recent ultrasound, endometrial polyp  Pre-operative Diagnosis: dysfunctional uterine bleeding, suspected adenomyosis, endometrial polyp  Post-operative Diagnosis: Same  Surgeon: Surgeon(s) and Role:    * Hildred Laser, MD - Primary   Assistants: None  Procedure:   DILATATION & CURETTAGE/HYSTEROSCOPY WITH MINERVA ABLATION  ENDOMETRIAL POLYPECTOMY WITH MYOSURE  Anesthesia: General endotracheal anesthesia  ASA Class: 2  Procedure Details: Patient was seen in the preoperative holding area where the consent was reviewed. Patient was consented for the following procedure: Hysteroscopy Dilation and Curettage with polypectomy with Minerva endometrial ablation. Pt was taken to the operating room # 5.    The patient was then placed under general anesthesia without difficulty.  She was then prepped and draped in the normal sterile fashion, and placed in the dorsal lithotomy position.  A time out was performed.  An exam under anesthesia was performed with the findings noted above.  Straight catheterization was performed. A sterile speculum was inserted into vagina. A single-tooth tenaculum was used to grasp the anterior lip of the cervix. Cervical dilation was performed. A 5 mm hysteroscope was introduced into the uterus under direct visualization. The cavity was allowed to fill, and then the entire cavity was explored with the findings described above. They Myosure Lite device was then introduced into the uterine cavity and polypectomy was performed. The Myosure Lite device and the hysteroscope were then removed, and a sharp curette was then passed into the uterus and endometrial sampling was collected for pathology.   Next the Minerva device was primed per instructions. The instrument was then placed into the endometrial canal and activated.   The Minerva  device was then removed from the uterine cavity.  The hysteroscope was then re-introduced for final survey, with adequate charring of the endometrium noted.  The hysteroscope was removed from the patient's uterine cavity. The tenaculum was removed and excellent hemostasis was noted. The speculum was removed from the vagina.   All instrument and sponge counts were correct at the end of the procedure x 2.  The patient tolerated the procedure well.  She was awakened and taken to the PACU in stable condition.   Findings: Uterus sounded to 10 cm.  Cervical length 4 cm.  Weakly proliferative endometrium.  Tubal ostia were visualized bilaterally. Large endometrial polyp observed protruding from left uterine wall Adequate charring of endometrial tissue post ablation.  No perforations noted.   Estimated Blood Loss:  15 ml      Drains: straight catheterization with 350 cc at start of procedure.          Total IV Fluids:  800 ml.  Fluid deficit 235 ml.   Specimens:  Endometrial curettings, endometrial polyp         Implants: None         Complications:  None; patient tolerated the procedure well.         Disposition: PACU - hemodynamically stable.         Condition: stable   Hildred Laser, MD [facility]

## 2021-09-28 NOTE — Interval H&P Note (Signed)
History and Physical Interval Note:  09/28/2021 12:28 PM  Heather Mccann  has presented today for surgery, with the diagnosis of endometrial polyp,  menorrhagia. Also with suspected adenomyosis.l  The various methods of treatment have been discussed with the patient and family. After consideration of risks, benefits and other options for treatment, the patient has consented to  Procedure(s): DILATATION AND CURETTAGE/HYSTEROSCOPY WITH MINERVA (N/A) POLYPECTOMY (N/A) as a surgical intervention.  The patient's history has been reviewed, patient examined, no change in status, stable for surgery.  I have reviewed the patient's chart and labs.  Questions were answered to the patient's satisfaction.     Hildred Laser, MD [facility]

## 2021-09-28 NOTE — Discharge Instructions (Signed)

## 2021-09-28 NOTE — Anesthesia Postprocedure Evaluation (Signed)
Anesthesia Post Note  Patient: Heather Mccann  Procedure(s) Performed: DILATATION AND CURETTAGE/HYSTEROSCOPY WITH MINERVA POLYPECTOMY  Patient location during evaluation: PACU Anesthesia Type: General Level of consciousness: awake and oriented Pain management: satisfactory to patient Vital Signs Assessment: post-procedure vital signs reviewed and stable Respiratory status: spontaneous breathing and nonlabored ventilation Cardiovascular status: stable Anesthetic complications: no   No notable events documented.   Last Vitals:  Vitals:   09/28/21 1111 09/28/21 1430  BP: 112/88 128/80  Pulse: 85 88  Resp: 16 20  Temp: (!) 36.3 C (!) 36.3 C  SpO2: 99% 96%    Last Pain:  Vitals:   09/28/21 1430  TempSrc:   PainSc: 3                  VAN STAVEREN,Dessirae Scarola

## 2021-09-29 ENCOUNTER — Encounter: Payer: Self-pay | Admitting: Obstetrics and Gynecology

## 2021-09-30 LAB — SURGICAL PATHOLOGY

## 2021-10-13 NOTE — Progress Notes (Unsigned)
    OBSTETRICS/GYNECOLOGY POST-OPERATIVE CLINIC VISIT  Subjective:     Heather Mccann is a 46 y.o. female who presents to the clinic {1-10:13787} weeks status post *** for ***. Eating a regular diet {with-without:5700} difficulty. Bowel movements are {normal/abnormal***:19619}. {pain control:13522::"The patient is not having any pain."}  {Common ambulatory SmartLinks:19316}  Review of Systems {ros; complete:30496}   Objective:   There were no vitals taken for this visit. There is no height or weight on file to calculate BMI.  General:  alert and no distress  Abdomen: soft, bowel sounds active, non-tender  Incision:   {incision:13716::"no dehiscence","incision well approximated","healing well","no drainage","no erythema","no hernia","no seroma","no swelling"}    Pathology:    Assessment:   Patient s/p *** (surgery)  {doing well:13525::"Doing well postoperatively."}   Plan:   1. Continue any current medications as instructed by provider. 2. Wound care discussed. 3. Operative findings again reviewed. Pathology report discussed. 4. Activity restrictions: {restrictions:13723} 5. Anticipated return to work: {work return:14002}. 6. Follow up: {5-57:32202} {time; units:18646} for ***    Loney Laurence, CMA [facility]

## 2021-10-14 ENCOUNTER — Encounter: Payer: Self-pay | Admitting: Obstetrics and Gynecology

## 2021-10-14 ENCOUNTER — Ambulatory Visit (INDEPENDENT_AMBULATORY_CARE_PROVIDER_SITE_OTHER): Payer: Medicaid Other | Admitting: Obstetrics and Gynecology

## 2021-10-14 VITALS — BP 115/89 | HR 94 | Resp 16 | Ht 69.0 in | Wt 191.5 lb

## 2021-10-14 DIAGNOSIS — Z09 Encounter for follow-up examination after completed treatment for conditions other than malignant neoplasm: Secondary | ICD-10-CM

## 2021-10-14 DIAGNOSIS — Z1231 Encounter for screening mammogram for malignant neoplasm of breast: Secondary | ICD-10-CM

## 2021-10-14 DIAGNOSIS — Z9889 Other specified postprocedural states: Secondary | ICD-10-CM

## 2021-12-21 ENCOUNTER — Ambulatory Visit
Admission: RE | Admit: 2021-12-21 | Discharge: 2021-12-21 | Disposition: A | Discharge status: Home / Self Care | Payer: Medicaid Other | Source: Ambulatory Visit | Attending: Obstetrics and Gynecology | Admitting: Obstetrics and Gynecology

## 2021-12-21 ENCOUNTER — Encounter: Payer: Self-pay | Admitting: Radiology

## 2021-12-21 DIAGNOSIS — Z1231 Encounter for screening mammogram for malignant neoplasm of breast: Secondary | ICD-10-CM | POA: Diagnosis present

## 2022-04-15 ENCOUNTER — Ambulatory Visit: Admit: 2022-04-15 | Disposition: A | Discharge status: Home / Self Care | Payer: Medicaid Other

## 2022-04-15 ENCOUNTER — Emergency Department: Payer: Medicaid Other

## 2022-04-15 ENCOUNTER — Emergency Department
Admission: EM | Admit: 2022-04-15 | Discharge: 2022-04-15 | Disposition: A | Discharge status: Home / Self Care | Payer: Medicaid Other | Attending: Emergency Medicine | Admitting: Emergency Medicine

## 2022-04-15 ENCOUNTER — Other Ambulatory Visit: Payer: Self-pay

## 2022-04-15 ENCOUNTER — Ambulatory Visit: Payer: Self-pay

## 2022-04-15 DIAGNOSIS — R102 Pelvic and perineal pain: Secondary | ICD-10-CM | POA: Diagnosis present

## 2022-04-15 DIAGNOSIS — N858 Other specified noninflammatory disorders of uterus: Secondary | ICD-10-CM | POA: Diagnosis not present

## 2022-04-15 DIAGNOSIS — N9489 Other specified conditions associated with female genital organs and menstrual cycle: Secondary | ICD-10-CM

## 2022-04-15 LAB — LIPASE, BLOOD: Lipase: 40 U/L (ref 11–51)

## 2022-04-15 LAB — CBC
HCT: 45.5 % (ref 36.0–46.0)
Hemoglobin: 14.7 g/dL (ref 12.0–15.0)
MCH: 28.8 pg (ref 26.0–34.0)
MCHC: 32.3 g/dL (ref 30.0–36.0)
MCV: 89.2 fL (ref 80.0–100.0)
Platelets: 296 10*3/uL (ref 150–400)
RBC: 5.1 MIL/uL (ref 3.87–5.11)
RDW: 13.3 % (ref 11.5–15.5)
WBC: 8.1 10*3/uL (ref 4.0–10.5)
nRBC: 0 % (ref 0.0–0.2)

## 2022-04-15 LAB — COMPREHENSIVE METABOLIC PANEL
ALT: 19 U/L (ref 0–44)
AST: 23 U/L (ref 15–41)
Albumin: 4 g/dL (ref 3.5–5.0)
Alkaline Phosphatase: 59 U/L (ref 38–126)
Anion gap: 12 (ref 5–15)
BUN: 12 mg/dL (ref 6–20)
CO2: 21 mmol/L — ABNORMAL LOW (ref 22–32)
Calcium: 9.3 mg/dL (ref 8.9–10.3)
Chloride: 103 mmol/L (ref 98–111)
Creatinine, Ser: 0.71 mg/dL (ref 0.44–1.00)
GFR, Estimated: >60 mL/min (ref >60)
Glucose, Bld: 111 mg/dL — ABNORMAL HIGH (ref 70–99)
Potassium: 4 mmol/L (ref 3.5–5.1)
Sodium: 136 mmol/L (ref 135–145)
Total Bilirubin: 1 mg/dL (ref 0.3–1.2)
Total Protein: 7.2 g/dL (ref 6.5–8.1)

## 2022-04-15 LAB — URINALYSIS, ROUTINE W REFLEX MICROSCOPIC
Bilirubin Urine: NEGATIVE
Glucose, UA: NEGATIVE mg/dL
Ketones, ur: NEGATIVE mg/dL
Leukocytes,Ua: NEGATIVE
Nitrite: NEGATIVE
Protein, ur: NEGATIVE mg/dL
Specific Gravity, Urine: 1.002 — ABNORMAL LOW (ref 1.005–1.030)
WBC, UA: NONE SEEN WBC/hpf (ref 0–5)
pH: 6 (ref 5.0–8.0)

## 2022-04-15 LAB — HCG, QUANTITATIVE, PREGNANCY: hCG, Beta Chain, Quant, S: <1 m[IU]/mL (ref <5)

## 2022-04-15 LAB — POC URINE PREG, ED: Preg Test, Ur: NEGATIVE

## 2022-04-15 MED ORDER — IOHEXOL 300 MG/ML  SOLN
100.0000 mL | Freq: Once | INTRAMUSCULAR | Status: AC | PRN
  Administered 2022-04-15: 100 mL via INTRAVENOUS

## 2022-04-15 MED ORDER — KETOROLAC TROMETHAMINE 15 MG/ML IJ SOLN
15.0000 mg | Freq: Once | INTRAMUSCULAR | Status: AC
  Administered 2022-04-15: 15 mg via INTRAVENOUS
  Filled 2022-04-15: qty 1

## 2022-04-15 MED ORDER — IBUPROFEN 800 MG PO TABS: 800.0000 mg | ORAL_TABLET | Freq: Three times a day (TID) | ORAL | 0 refills | Status: DC | PRN

## 2022-04-15 NOTE — ED Provider Notes (Signed)
[facility] Provider Note    Event Date/Time   First MD Initiated Contact with Patient 04/15/22 1620     (approximate)   History   Abdominal Pain   HPI  CHINYERE FARRENKOPF is a 47 y.o. female who comes in with lower pelvic pain that started a few days ago.  She does report having ablation back in August.  She denies any nausea, vomiting.  Patient reports severe pelvic pain that started a few days ago.  She reports taking ibuprofen last early this morning but nothing since.  She reports the pain is worse when she walks that she is to lift up her stomach in order to help with the pelvic pain.  She denies any vaginal bleeding since her ablation but she tried to call her OB/GYN and they told her to come in to get reevaluated.  She denies any other associated symptoms with it.  No discharge.  She reports being with 1 partner who is a father of her children so she has no concerns for STDs.   Physical Exam   Triage Vital Signs: ED Triage Vitals  Enc Vitals Group     BP 04/15/22 1605 (!) 143/103     Pulse Rate 04/15/22 1605 (!) 102     Resp 04/15/22 1605 18     Temp 04/15/22 1605 98 F (36.7 C)     Temp src --      SpO2 04/15/22 1605 97 %     Weight --      Height --      Head Circumference --      Peak Flow --      Pain Score 04/15/22 1604 10     Pain Loc --      Pain Edu? --      Excl. in [place]? --     Most recent vital signs: Vitals:   04/15/22 1605  BP: (!) 143/103  Pulse: (!) 102  Resp: 18  Temp: 98 F (36.7 C)  SpO2: 97%     General: Awake, no distress.  CV:  Good peripheral perfusion.  Resp:  Normal effort.  Abd:  No distention.  Tender in the lower abdomen, pelvic area. Other:     ED Results / Procedures / Treatments   Labs (all labs ordered are listed, but only abnormal results are displayed) Labs Reviewed  COMPREHENSIVE METABOLIC PANEL - Abnormal; Notable for the following components:      Result Value   CO2 21 (*)    Glucose,  Bld 111 (*)    All other components within normal limits  URINALYSIS, ROUTINE W REFLEX MICROSCOPIC - Abnormal; Notable for the following components:   Color, Urine STRAW (*)    APPearance CLEAR (*)    Specific Gravity, Urine 1.002 (*)    Hgb urine dipstick SMALL (*)    Bacteria, UA RARE (*)    All other components within normal limits  LIPASE, BLOOD  CBC  HCG, QUANTITATIVE, PREGNANCY  POC URINE PREG, ED       RADIOLOGY I have reviewed the CT personally and interpreted and no evidence of any kidney stones  PROCEDURES:  Critical Care performed: No  Procedures   MEDICATIONS ORDERED IN ED: Medications  ketorolac (TORADOL) 15 MG/ML injection 15 mg (15 mg Intravenous Given 04/15/22 1644)  iohexol (OMNIPAQUE) 300 MG/ML solution 100 mL (100 mLs Intravenous Contrast Given 04/15/22 1757)     IMPRESSION / MDM / ASSESSMENT AND PLAN / ED  COURSE  I reviewed the triage vital signs and the nursing notes.   Patient's presentation is most consistent with acute presentation with potential threat to life or bodily function.   Differential is ovarian cyst ovarian torsion pregnancy.  Pregnancy test was negative CMP reassuring CBC reassuring blood hCG was negative urine without evidence of any UTI.  Ultrasound shows a endometrial mass which on review of records she is actually had this previously and she does report that it was removed during the ablation and tested and was a polyp.  This could be a recurrent polyp but I discussed with patient the risk for cancer and the importance of having this follow-up outpatient.  I have discussed the case with Gigi Gin who also agreed that this could be dealt with outpatient.  Her abdominal pain seems to be more with movements and it could be more musculoskeletal and not related to the above mass at all.  She has got no vaginal bleeding or vaginal discharge.  We discussed pelvic exam but patient declined she has been with 1 partner her husband and  declines STD testing.  We discussed pain control with Tylenol, ibuprofen.  She reports previously ibuprofen 800 have worked very well for her.  We discussed oxycodone but of opted to hold off due to side effects from these types of medications.  At this time she feels comfortable with discharge home and will follow-up outpatient with OB/GYN  7:02 PM repeat exam patient reports feeling much better she is ambulatory without any significant pelvic pain and reports the Toradol definitely seem to help  FINAL CLINICAL IMPRESSION(S) / ED DIAGNOSES   Final diagnoses:  Pelvic pain in female  Endometrial mass     Rx / DC Orders   ED Discharge Orders     None        Note:  This document was prepared using Dragon voice recognition software and may include unintentional dictation errors.   Vanessa [place], MD 04/15/22 [facility] Hare

## 2022-04-15 NOTE — ED Notes (Signed)
Pt returned from [place], pt states pain is decreased since pain meds.

## 2022-04-15 NOTE — ED Notes (Signed)
Pt with lower abdominal pain in RLQ and LLQ x several day. Pt states pain is worse when she is upright.

## 2022-04-15 NOTE — Discharge Instructions (Addendum)
Take the ibuprofen with food to help prevent upset stomach.  Use this with Tylenol 1 g every 8 hours.  Follow-up with your OB/GYN for your results as we discussed below.   IMPRESSION: 1. Endometrial thickening with some enhancement, assessed on pelvic ultrasound earlier today. 3.3 cm right ovarian cyst, simple by ultrasound comminuted no further follow-up imaging. 2. No other acute findings in the abdomen or pelvis. 3. Tiny hiatal hernia.    IMPRESSION: 1. Mildly heterogeneous and mildly vascular endometrial mass about 3.2 cm in diameter along the uterine body, along with a notable collection of complex fluid with a fluid-fluid level along the fundal side of the mass for example on image 52 of series 1 -2. Recurrent polyp or endometrial malignancy not excluded. This may warrant sampling or hysteroscopic assessment. 2. Simple cyst of the right ovary requiring no further workup. 3. No evidence of ovarian torsion.

## 2022-04-15 NOTE — ED Triage Notes (Signed)
Pt comes with c/o lower pelvic pain that started few days ago. Pt states it has now gotten worse and is becoming unbearable. Pt states ablation back in August.  Pt denies any N/V. Pt states she even took preg test which was neg. Pt states it is intense pressure. Pt denies any discharge or bleeding.

## 2022-04-18 NOTE — Progress Notes (Unsigned)
    Evern Bio, NP   No chief complaint on file.   HPI:      Ms. Heather Mccann is a 47 y.o. [id number] whose LMP was No LMP recorded. Patient has had an ablation., presents today for *** Had CT and u/s in ED 04/15/22 with 3.4 cm RTO simple cyst***   There are no problems to display for this patient.   Past Surgical History:  Procedure Laterality Date   DILATATION AND CURETTAGE/HYSTEROSCOPY WITH MINERVA N/A 09/28/2021   Procedure: DILATATION AND CURETTAGE/HYSTEROSCOPY WITH MINERVA;  Surgeon: Rubie Maid, MD;  Location: [facility];  Service: Gynecology;  Laterality: N/A;   DILATION AND CURETTAGE OF UTERUS     POLYPECTOMY N/A 09/28/2021   Procedure: POLYPECTOMY;  Surgeon: Rubie Maid, MD;  Location: [facility];  Service: Gynecology;  Laterality: N/A;   WISDOM TOOTH EXTRACTION      No family history on file.  Social History   Socioeconomic History   Marital status: Divorced    Spouse name: Not on file   Number of children: 2   Years of education: Not on file   Highest education level: Not on file  Occupational History   Not on file  Tobacco Use   Smoking status: Every Day    Types: Cigarettes   Smokeless tobacco: Never  Vaping Use   Vaping Use: Never used  Substance and Sexual Activity   Alcohol use: Never   Drug use: Never   Sexual activity: Yes    Birth control/protection: I.U.D.  Other Topics Concern   Not on file  Social History Narrative   Not on file   Social Determinants of Health   Financial Resource Strain: Not on file  Food Insecurity: Not on file  Transportation Needs: Not on file  Physical Activity: Not on file  Stress: Not on file  Social Connections: Not on file  Intimate Partner Violence: Not on file    Outpatient Medications Prior to Visit  Medication Sig Dispense Refill   ibuprofen (ADVIL) 800 MG tablet Take 1 tablet (800 mg total) by mouth every 8 (eight) hours as needed. 30 tablet 0   No facility-administered medications prior to  visit.      ROS:  Review of Systems BREAST: No symptoms   OBJECTIVE:   Vitals:  There were no vitals taken for this visit.  Physical Exam  Results: No results found for this or any previous visit (from the past 24 hour(s)).   Assessment/Plan: No diagnosis found.    No orders of the defined types were placed in this encounter.     No follow-ups on file.  Danea Manter B. Irmalee Riemenschneider, PA-C 04/18/2022 7:45 PM

## 2022-04-19 ENCOUNTER — Ambulatory Visit: Payer: Medicaid Other | Admitting: Obstetrics and Gynecology

## 2022-04-19 ENCOUNTER — Encounter: Payer: Self-pay | Admitting: Obstetrics and Gynecology

## 2022-04-19 VITALS — BP 106/78 | Ht 68.75 in | Wt 208.0 lb

## 2022-04-19 DIAGNOSIS — N83201 Unspecified ovarian cyst, right side: Secondary | ICD-10-CM | POA: Diagnosis not present

## 2022-04-19 DIAGNOSIS — R102 Pelvic and perineal pain unspecified side: Secondary | ICD-10-CM

## 2022-04-19 DIAGNOSIS — Z30011 Encounter for initial prescription of contraceptive pills: Secondary | ICD-10-CM

## 2022-04-19 MED ORDER — NORETHINDRONE 0.35 MG PO TABS: 1.0000 | ORAL_TABLET | Freq: Every day | ORAL | 1 refills | Status: DC

## 2022-05-21 ENCOUNTER — Other Ambulatory Visit: Payer: Self-pay | Admitting: Family Medicine

## 2022-05-21 DIAGNOSIS — T8332XA Displacement of intrauterine contraceptive device, initial encounter: Secondary | ICD-10-CM

## 2022-05-21 DIAGNOSIS — N939 Abnormal uterine and vaginal bleeding, unspecified: Secondary | ICD-10-CM

## 2022-09-01 ENCOUNTER — Other Ambulatory Visit: Payer: Self-pay | Admitting: Obstetrics and Gynecology

## 2022-09-01 DIAGNOSIS — Z30011 Encounter for initial prescription of contraceptive pills: Secondary | ICD-10-CM

## 2022-12-31 ENCOUNTER — Encounter: Payer: Self-pay | Admitting: Obstetrics and Gynecology

## 2022-12-31 ENCOUNTER — Other Ambulatory Visit: Payer: Self-pay | Admitting: Obstetrics and Gynecology

## 2022-12-31 DIAGNOSIS — Z30011 Encounter for initial prescription of contraceptive pills: Secondary | ICD-10-CM

## 2022-12-31 MED ORDER — NORETHINDRONE 0.35 MG PO TABS: 1.0000 | ORAL_TABLET | Freq: Every day | ORAL | 0 refills | Status: DC

## 2023-03-09 ENCOUNTER — Other Ambulatory Visit: Payer: Self-pay | Admitting: Obstetrics and Gynecology

## 2023-03-09 DIAGNOSIS — Z30011 Encounter for initial prescription of contraceptive pills: Secondary | ICD-10-CM

## 2023-03-15 NOTE — Progress Notes (Unsigned)
PCP: Orson Eva, NP   No chief complaint on file.   HPI:      Ms. Heather Mccann is a 48 y.o. [id number] whose LMP was No LMP recorded. Patient has had an ablation., presents today for her annual examination.  Her menses are {norm/abn:715}, lasting {number: 22536} days.  Dysmenorrhea {dysmen:716}. She {does:18564} have intermenstrual bleeding.Pt is s/p hyst/D&C with minerva ablation for DU/endometrial polyp 8/23.  POPs added for ovar cyst prevention She {does:18564} have vasomotor sx.   Sex activity: {sex active: 315163}. She {does:18564} have vaginal dryness.  Last Pap: {AVWU:[id number]}  Results were: {norm/abn:16707::"no abnormalities"} /neg HPV DNA.  Hx of STDs: {STD hx:14358}  Last mammogram: 12/21/21  Results were: normal--routine follow-up in 12 months There is no FH of breast cancer. There is no FH of ovarian cancer. The patient {does:18564} do self-breast exams.  Colonoscopy: {hx:15363}  Repeat due after 10*** years.   Tobacco use: {tob:20664} Alcohol use: {Alcohol:11675} No drug use Exercise: {exercise:31265}  She {does:18564} get adequate calcium and Vitamin D in her diet.  Labs with PCP.   There are no active problems to display for this patient.   Past Surgical History:  Procedure Laterality Date   DILATATION AND CURETTAGE/HYSTEROSCOPY WITH MINERVA N/A 09/28/2021   Procedure: DILATATION AND CURETTAGE/HYSTEROSCOPY WITH MINERVA;  Surgeon: Hildred Laser, MD;  Location: [facility];  Service: Gynecology;  Laterality: N/A;   DILATION AND CURETTAGE OF UTERUS     POLYPECTOMY N/A 09/28/2021   Procedure: POLYPECTOMY;  Surgeon: Hildred Laser, MD;  Location: [facility];  Service: Gynecology;  Laterality: N/A;   WISDOM TOOTH EXTRACTION      No family history on file.  Social History   Socioeconomic History   Marital status: Divorced    Spouse name: Not on file   Number of children: 2   Years of education: Not on file   Highest education level: Not on file   Occupational History   Not on file  Tobacco Use   Smoking status: Every Day    Types: Cigarettes   Smokeless tobacco: Never  Vaping Use   Vaping status: Never Used  Substance and Sexual Activity   Alcohol use: Never   Drug use: Never   Sexual activity: Yes    Birth control/protection: None  Other Topics Concern   Not on file  Social History Narrative   Not on file   Social Drivers of Health   Financial Resource Strain: Not on file  Food Insecurity: Not on file  Transportation Needs: Not on file  Physical Activity: Not on file  Stress: Not on file  Social Connections: Not on file  Intimate Partner Violence: Not on file     Current Outpatient Medications:    ibuprofen (ADVIL) 800 MG tablet, Take 1 tablet (800 mg total) by mouth every 8 (eight) hours as needed., Disp: 30 tablet, Rfl: 0   norethindrone (MICRONOR) 0.35 MG tablet, TAKE 1 TABLET BY MOUTH EVERY DAY, Disp: 84 tablet, Rfl: 0     ROS:  Review of Systems BREAST: No symptoms    Objective: There were no vitals taken for this visit.   OBGyn Exam  Results: No results found for this or any previous visit (from the past 24 hours).  Assessment/Plan:  No diagnosis found.   No orders of the defined types were placed in this encounter.           GYN counsel {counseling: 16159}    F/U  No follow-ups on file.  Heather Muster  B. Camya Haydon, PA-C 03/15/2023 4:49 PM

## 2023-03-17 ENCOUNTER — Ambulatory Visit (INDEPENDENT_AMBULATORY_CARE_PROVIDER_SITE_OTHER): Payer: Medicaid Other | Admitting: Obstetrics and Gynecology

## 2023-03-17 ENCOUNTER — Other Ambulatory Visit (HOSPITAL_COMMUNITY)
Admission: RE | Admit: 2023-03-17 | Discharge: 2023-03-17 | Disposition: A | Discharge status: Home / Self Care | Payer: Medicaid Other | Source: Ambulatory Visit | Attending: Obstetrics and Gynecology | Admitting: Obstetrics and Gynecology

## 2023-03-17 ENCOUNTER — Encounter: Payer: Self-pay | Admitting: Obstetrics and Gynecology

## 2023-03-17 VITALS — BP 132/85 | HR 101 | Ht 69.0 in | Wt 215.0 lb

## 2023-03-17 DIAGNOSIS — Z1151 Encounter for screening for human papillomavirus (HPV): Secondary | ICD-10-CM | POA: Insufficient documentation

## 2023-03-17 DIAGNOSIS — Z6831 Body mass index (BMI) 31.0-31.9, adult: Secondary | ICD-10-CM

## 2023-03-17 DIAGNOSIS — Z3041 Encounter for surveillance of contraceptive pills: Secondary | ICD-10-CM

## 2023-03-17 DIAGNOSIS — Z9889 Other specified postprocedural states: Secondary | ICD-10-CM

## 2023-03-17 DIAGNOSIS — Z0000 Encounter for general adult medical examination without abnormal findings: Secondary | ICD-10-CM

## 2023-03-17 DIAGNOSIS — Z13220 Encounter for screening for lipoid disorders: Secondary | ICD-10-CM

## 2023-03-17 DIAGNOSIS — Z01419 Encounter for gynecological examination (general) (routine) without abnormal findings: Secondary | ICD-10-CM | POA: Diagnosis not present

## 2023-03-17 DIAGNOSIS — Z124 Encounter for screening for malignant neoplasm of cervix: Secondary | ICD-10-CM | POA: Insufficient documentation

## 2023-03-17 DIAGNOSIS — Z1211 Encounter for screening for malignant neoplasm of colon: Secondary | ICD-10-CM

## 2023-03-17 DIAGNOSIS — Z1231 Encounter for screening mammogram for malignant neoplasm of breast: Secondary | ICD-10-CM

## 2023-03-17 DIAGNOSIS — Z131 Encounter for screening for diabetes mellitus: Secondary | ICD-10-CM

## 2023-03-17 MED ORDER — NORETHINDRONE 0.35 MG PO TABS: 1.0000 | ORAL_TABLET | Freq: Every day | ORAL | 3 refills | Status: DC

## 2023-03-17 NOTE — Patient Instructions (Signed)
I value your feedback and you entrusting [place] with your care. If you get a [facility] patient survey, I would appreciate you taking the time to let us know about your experience today. Thank you!  [facility] ([place])--[phone]

## 2023-03-18 LAB — LIPID PANEL
Chol/HDL Ratio: 4.5 {ratio} — ABNORMAL HIGH (ref 0.0–4.4)
Cholesterol, Total: 197 mg/dL (ref 100–199)
HDL: 44 mg/dL (ref >39)
LDL Chol Calc (NIH): 127 mg/dL — ABNORMAL HIGH (ref 0–99)
Triglycerides: 143 mg/dL (ref 0–149)
VLDL Cholesterol Cal: 26 mg/dL (ref 5–40)

## 2023-03-18 LAB — COMPREHENSIVE METABOLIC PANEL
ALT: 19 [IU]/L (ref 0–32)
AST: 17 [IU]/L (ref 0–40)
Albumin: 4.4 g/dL (ref 3.9–4.9)
Alkaline Phosphatase: 53 [IU]/L (ref 44–121)
BUN/Creatinine Ratio: 14 (ref 9–23)
BUN: 11 mg/dL (ref 6–24)
Bilirubin Total: 0.5 mg/dL (ref 0.0–1.2)
CO2: 23 mmol/L (ref 20–29)
Calcium: 10 mg/dL (ref 8.7–10.2)
Chloride: 105 mmol/L (ref 96–106)
Creatinine, Ser: 0.77 mg/dL (ref 0.57–1.00)
Globulin, Total: 2.2 g/dL (ref 1.5–4.5)
Glucose: 106 mg/dL — ABNORMAL HIGH (ref 70–99)
Potassium: 4.5 mmol/L (ref 3.5–5.2)
Sodium: 143 mmol/L (ref 134–144)
Total Protein: 6.6 g/dL (ref 6.0–8.5)
eGFR: 97 mL/min/{1.73_m2} (ref >59)

## 2023-03-18 LAB — HEMOGLOBIN A1C
Est. average glucose Bld gHb Est-mCnc: 117 mg/dL
Hgb A1c MFr Bld: 5.7 % — ABNORMAL HIGH (ref 4.8–5.6)

## 2023-03-22 ENCOUNTER — Encounter: Payer: Self-pay | Admitting: Obstetrics and Gynecology

## 2023-03-22 LAB — CYTOLOGY - PAP
Adequacy: ABSENT
Comment: NEGATIVE
Diagnosis: NEGATIVE
High risk HPV: NEGATIVE

## 2023-05-07 ENCOUNTER — Encounter: Payer: Self-pay | Admitting: Obstetrics and Gynecology

## 2023-05-07 LAB — COLOGUARD: COLOGUARD: NEGATIVE

## 2023-05-24 ENCOUNTER — Encounter: Payer: Self-pay | Admitting: Cardiology

## 2023-05-24 ENCOUNTER — Ambulatory Visit: Admitting: Cardiology

## 2023-05-24 VITALS — BP 104/62 | HR 85 | Ht 69.0 in | Wt 207.0 lb

## 2023-05-24 DIAGNOSIS — E559 Vitamin D deficiency, unspecified: Secondary | ICD-10-CM | POA: Diagnosis not present

## 2023-05-24 DIAGNOSIS — R7303 Prediabetes: Secondary | ICD-10-CM | POA: Insufficient documentation

## 2023-05-24 DIAGNOSIS — E782 Mixed hyperlipidemia: Secondary | ICD-10-CM | POA: Diagnosis not present

## 2023-05-24 DIAGNOSIS — R5383 Other fatigue: Secondary | ICD-10-CM

## 2023-05-24 DIAGNOSIS — E66811 Obesity, class 1: Secondary | ICD-10-CM | POA: Insufficient documentation

## 2023-05-24 DIAGNOSIS — E6609 Other obesity due to excess calories: Secondary | ICD-10-CM

## 2023-05-24 DIAGNOSIS — Z6830 Body mass index (BMI) 30.0-30.9, adult: Secondary | ICD-10-CM

## 2023-05-24 DIAGNOSIS — Z713 Dietary counseling and surveillance: Secondary | ICD-10-CM | POA: Insufficient documentation

## 2023-05-24 DIAGNOSIS — Z0130 Encounter for examination of blood pressure without abnormal findings: Secondary | ICD-10-CM

## 2023-05-24 MED ORDER — WEGOVY 0.25 MG/0.5ML ~~LOC~~ SOAJ: 0.2500 mg | SUBCUTANEOUS | 3 refills | Status: DC

## 2023-05-24 NOTE — Progress Notes (Signed)
 Established Patient Office Visit  Subjective:  Patient ID: Heather Mccann, female    DOB: Apr 29, 1975  Age: 48 y.o. MRN: [id number]  Chief Complaint  Patient presents with   Follow-up    ReEstablish Care not seen in 2 years    Patient in office for an over due follow up. Patient doing well, complains of fatigue.   Discussed recent lab work. Hgb A1c elevated, pre diabetic. LDL elevated. Patient states she has been trying to lose weight without success. Discussed options. Patient interested in GLP1. Will send in [facility]. Handouts given to patient on diabetic diet and mediterranean diet.  Will check TSH and vitamin D levels today.     No other concerns at this time.   Past Medical History:  Diagnosis Date   Abnormal uterine bleeding (AUB)    Ovarian cyst     Past Surgical History:  Procedure Laterality Date   DILATATION AND CURETTAGE/HYSTEROSCOPY WITH MINERVA N/A 09/28/2021   Procedure: DILATATION AND CURETTAGE/HYSTEROSCOPY WITH MINERVA;  Surgeon: Hildred Laser, MD;  Location: [facility];  Service: Gynecology;  Laterality: N/A;   DILATION AND CURETTAGE OF UTERUS     POLYPECTOMY N/A 09/28/2021   Procedure: POLYPECTOMY;  Surgeon: Hildred Laser, MD;  Location: [facility];  Service: Gynecology;  Laterality: N/A;   WISDOM TOOTH EXTRACTION      Social History   Socioeconomic History   Marital status: Divorced    Spouse name: Not on file   Number of children: 2   Years of education: Not on file   Highest education level: Not on file  Occupational History   Not on file  Tobacco Use   Smoking status: Every Day    Types: Cigarettes   Smokeless tobacco: Never  Vaping Use   Vaping status: Never Used  Substance and Sexual Activity   Alcohol use: Never   Drug use: Never   Sexual activity: Yes    Birth control/protection: Pill  Other Topics Concern   Not on file  Social History Narrative   Not on file   Social Drivers of Health   Financial Resource Strain: Not on file  Food  Insecurity: Not on file  Transportation Needs: Not on file  Physical Activity: Not on file  Stress: Not on file  Social Connections: Not on file  Intimate Partner Violence: Not on file    Family History  Problem Relation Age of Onset   Breast cancer Neg Hx    Ovarian cancer Neg Hx     No Known Allergies  Outpatient Medications Prior to Visit  Medication Sig   norethindrone (MICRONOR) 0.35 MG tablet Take 1 tablet (0.35 mg total) by mouth daily.   No facility-administered medications prior to visit.    Review of Systems  Constitutional: Negative.   HENT: Negative.    Eyes: Negative.   Respiratory: Negative.  Negative for shortness of breath.   Cardiovascular: Negative.  Negative for chest pain.  Gastrointestinal: Negative.  Negative for abdominal pain, constipation and diarrhea.  Genitourinary: Negative.   Musculoskeletal:  Negative for joint pain and myalgias.  Skin: Negative.   Neurological: Negative.  Negative for dizziness and headaches.  Endo/Heme/Allergies: Negative.   All other systems reviewed and are negative.      Objective:   BP 104/62   Pulse 85   Ht 5\' 9"  (1.753 m)   Wt 207 lb (93.9 kg)   SpO2 96%   BMI 30.57 kg/m   Vitals:   05/24/23 1030  BP: 104/62  Pulse: 85  Height: 5\' 9"  (1.753 m)  Weight: 207 lb (93.9 kg)  SpO2: 96%  BMI (Calculated): 30.55    Physical Exam Vitals and nursing note reviewed.  Constitutional:      Appearance: Normal appearance. She is normal weight.  HENT:     Head: Normocephalic and atraumatic.     Nose: Nose normal.     Mouth/Throat:     Mouth: Mucous membranes are moist.  Eyes:     Extraocular Movements: Extraocular movements intact.     Conjunctiva/sclera: Conjunctivae normal.     Pupils: Pupils are equal, round, and reactive to light.  Cardiovascular:     Rate and Rhythm: Normal rate and regular rhythm.     Pulses: Normal pulses.     Heart sounds: Normal heart sounds.  Pulmonary:     Effort: Pulmonary  effort is normal.     Breath sounds: Normal breath sounds.  Abdominal:     General: Abdomen is flat. Bowel sounds are normal.     Palpations: Abdomen is soft.  Musculoskeletal:        General: Normal range of motion.     Cervical back: Normal range of motion.  Skin:    General: Skin is warm and dry.  Neurological:     General: No focal deficit present.     Mental Status: She is alert and oriented to person, place, and time.  Psychiatric:        Mood and Affect: Mood normal.        Behavior: Behavior normal.        Thought Content: Thought content normal.        Judgment: Judgment normal.      No results found for any visits on 05/24/23.  Recent Results (from the past 2160 hours)  Cytology - PAP     Status: None   Collection Time: 03/17/23  9:03 AM  Result Value Ref Range   High risk HPV Negative    Adequacy      Satisfactory for evaluation; transformation zone component ABSENT.   Diagnosis      - Negative for intraepithelial lesion or malignancy (NILM)   Comment Normal Reference Range HPV - Negative   Comprehensive metabolic panel     Status: Abnormal   Collection Time: 03/17/23  9:21 AM  Result Value Ref Range   Glucose 106 (H) 70 - 99 mg/dL   BUN 11 6 - 24 mg/dL   Creatinine, Ser 6.96 0.57 - 1.00 mg/dL   eGFR 97 >29 BM/WUX/3.24   BUN/Creatinine Ratio 14 9 - 23   Sodium 143 134 - 144 mmol/L   Potassium 4.5 3.5 - 5.2 mmol/L   Chloride 105 96 - 106 mmol/L   CO2 23 20 - 29 mmol/L   Calcium 10.0 8.7 - 10.2 mg/dL   Total Protein 6.6 6.0 - 8.5 g/dL   Albumin 4.4 3.9 - 4.9 g/dL   Globulin, Total 2.2 1.5 - 4.5 g/dL   Bilirubin Total 0.5 0.0 - 1.2 mg/dL   Alkaline Phosphatase 53 44 - 121 IU/L   AST 17 0 - 40 IU/L   ALT 19 0 - 32 IU/L  Hemoglobin A1c     Status: Abnormal   Collection Time: 03/17/23  9:21 AM  Result Value Ref Range   Hgb A1c MFr Bld 5.7 (H) 4.8 - 5.6 %    Comment:          Prediabetes: 5.7 - 6.4  Diabetes: >6.4          Glycemic control for  adults with diabetes: <7.0    Est. average glucose Bld gHb Est-mCnc 117 mg/dL  Lipid panel     Status: Abnormal   Collection Time: 03/17/23  9:21 AM  Result Value Ref Range   Cholesterol, Total 197 100 - 199 mg/dL   Triglycerides 045 0 - 149 mg/dL   HDL 44 >40 mg/dL   VLDL Cholesterol Cal 26 5 - 40 mg/dL   LDL Chol Calc (NIH) 981 (H) 0 - 99 mg/dL   Chol/HDL Ratio 4.5 (H) 0.0 - 4.4 ratio    Comment:                                   T. Chol/HDL Ratio                                             Men  Women                               1/2 Avg.Risk  3.4    3.3                                   Avg.Risk  5.0    4.4                                2X Avg.Risk  9.6    7.1                                3X Avg.Risk 23.4   11.0   Cologuard     Status: None   Collection Time: 05/02/23  9:10 AM  Result Value Ref Range   COLOGUARD Negative Negative    Comment: NEGATIVE TEST RESULT. A negative Cologuard result indicates a low likelihood that a colorectal cancer (CRC) or advanced adenoma (adenomatous polyps with more advanced pre-malignant features)  is present. The chance that a person with a negative Cologuard test has a colorectal cancer is less than 1 in 1500 (negative predictive value >99.9%) or has an  advanced adenoma is less than  5.3% (negative predictive value 94.7%). These data are based on a prospective cross-sectional study of 10,000 individuals at average risk for colorectal cancer who were screened with both Cologuard and colonoscopy. (Imperiale T. et al, N Engl J Med 2014;370(14):1286-1297) The normal value (reference range) for this assay is negative.  COLOGUARD RE-SCREENING RECOMMENDATION: Periodic colorectal cancer screening is an important part of preventive healthcare for asymptomatic individuals at average risk for colorectal cancer.  Following a negative Cologuard result, the American Cancer Society and U.S.  Multi-Society Task Force screening guidelines recommend a Cologuard  re-screening interval of 3 years.  References: American Cancer Society Guideline for Colorectal Cancer Screening: https://www.cancer.org/cancer/colon-rectal-cancer/detection-diagnosis-staging/acs-recommendations.html.; Rex DK, Boland CR, Dominitz JK, Colorectal Cancer Screening: Recommendations for Physicians and Patients from the U.S. Multi-Society Task Force on Colorectal Cancer Screening , Am J Gastroenterology 2017; 112:1016-1030.  TEST DESCRIPTION: Composite algorithmic analysis of stool DNA-biomarkers with hemoglobin immunoassay.  Quantitative values of individual biomarkers are not reportable and are not associated with individual biomarker result reference ranges. Cologuard is intended for colorectal cancer screening of adults of either sex, 45 years or older, who are at average-risk for colorectal cancer (CRC). Cologuard has been approved for use by the U.S. FDA. The performance of Cologuard was  established in a cross sectional study of average-risk adults aged 75-84. Cologuard performance in patients ages 93 to 81 years was estimated by sub-group analysis of near-age groups. Colonoscopies performed for a positive result may find as the most clinically significant lesion: colorectal cancer [4.0%], advanced adenoma (including sessile serrated polyps greater than or equal to 1cm diameter) [20%] or non- advanced adenoma [31%]; or no colorectal neoplasia [45%]. These estimates are derived from a prospective cross-sectional screening study of 10,000 individuals at average risk for colorectal cancer who were screened with both Cologuard and colonoscopy. (Imperiale T. et al, Macy Mis J Med 2014;370(14):1286-1297.) Cologuard may produce a false negative or false positive result (no colorectal cancer or precancerous polyp present at colonoscopy follow up). A negative Cologuard test result does not guarantee the absence of CRC or advanced adenoma (pre-cancer). The current Cologuard  screening interval is every 3  years. Science writer and [place] Therapist, music). Cologuard performance data in a 10,000 patient pivotal study using colonoscopy as the reference method can be accessed at the following location: www.exactlabs.com/results. Additional description of the Cologuard test process, warnings and precautions can be found at www.cologuard.com.       Assessment & Plan:  [id number] sent in. TSH and vitamin D level today.   Problem List Items Addressed This Visit       Other   Prediabetes - Primary   Relevant Medications   Semaglutide-Weight Management (WEGOVY) 0.25 MG/0.5ML SOAJ   Mixed hyperlipidemia   Weight loss counseling, encounter for   Class 1 obesity due to excess calories without serious comorbidity with body mass index (BMI) of 30.0 to 30.9 in adult   Relevant Medications   Semaglutide-Weight Management (WEGOVY) 0.25 MG/0.5ML SOAJ   Vitamin D deficiency   Relevant Orders   VITAMIN D 25 Hydroxy (Vit-D Deficiency, Fractures)   Other Visit Diagnoses       Other fatigue       Relevant Orders   TSH       Return in about 6 weeks (around 07/05/2023).   Total time spent: 25 minutes  [employer], NP  05/24/2023   This document may have been prepared by Dragon Voice Recognition software and as such may include unintentional dictation errors.

## 2023-05-25 ENCOUNTER — Other Ambulatory Visit: Payer: Self-pay | Admitting: Cardiology

## 2023-05-25 LAB — VITAMIN D 25 HYDROXY (VIT D DEFICIENCY, FRACTURES): Vit D, 25-Hydroxy: 21.8 ng/mL — ABNORMAL LOW (ref 30.0–100.0)

## 2023-05-25 LAB — TSH: TSH: 0.502 u[IU]/mL (ref 0.450–4.500)

## 2023-05-25 MED ORDER — VITAMIN D (ERGOCALCIFEROL) 1.25 MG (50000 UNIT) PO CAPS
50000.0000 [IU] | ORAL_CAPSULE | ORAL | 11 refills | Status: AC
Start: 2023-05-25 — End: ?

## 2023-07-05 ENCOUNTER — Ambulatory Visit: Admitting: Cardiology

## 2023-07-06 ENCOUNTER — Ambulatory Visit: Admitting: Cardiology

## 2023-07-06 ENCOUNTER — Encounter: Payer: Self-pay | Admitting: Cardiology

## 2023-07-06 VITALS — BP 117/82 | HR 100 | Ht 69.0 in | Wt 203.2 lb

## 2023-07-06 DIAGNOSIS — E6609 Other obesity due to excess calories: Secondary | ICD-10-CM

## 2023-07-06 DIAGNOSIS — E559 Vitamin D deficiency, unspecified: Secondary | ICD-10-CM | POA: Diagnosis not present

## 2023-07-06 DIAGNOSIS — Z713 Dietary counseling and surveillance: Secondary | ICD-10-CM

## 2023-07-06 DIAGNOSIS — E66811 Obesity, class 1: Secondary | ICD-10-CM

## 2023-07-06 DIAGNOSIS — Z6830 Body mass index (BMI) 30.0-30.9, adult: Secondary | ICD-10-CM

## 2023-07-06 NOTE — Progress Notes (Signed)
 Established Patient Office Visit  Subjective:  Patient ID: Heather Mccann, female    DOB: 06/03/1975  Age: 48 y.o. MRN: [id number]  Chief Complaint  Patient presents with   Follow-up    6 week follow up    Patient in office for 6 week follow up. Patient first seen on 05/24/2023, complained of fatigue, unable to lose weight. TSH normal, vitamin D  level low, started on weekly vitamin D  supplement. Started on Wegovy  for weight loss.  Patient presents today with reports of feeling better. Patient states she has taken 5 doses of vitamin D , also taking an OTC vitamin B complex.  Patient has taken two doses of Wegovy . Patient reports making dietary changes, smoking fewer cigarettes.  Continue same medications.     No other concerns at this time.   Past Medical History:  Diagnosis Date   Abnormal uterine bleeding (AUB)    Ovarian cyst     Past Surgical History:  Procedure Laterality Date   DILATATION AND CURETTAGE/HYSTEROSCOPY WITH MINERVA N/A 09/28/2021   Procedure: DILATATION AND CURETTAGE/HYSTEROSCOPY WITH MINERVA;  Surgeon: Teresa Fender, MD;  Location: [facility];  Service: Gynecology;  Laterality: N/A;   DILATION AND CURETTAGE OF UTERUS     POLYPECTOMY N/A 09/28/2021   Procedure: POLYPECTOMY;  Surgeon: Teresa Fender, MD;  Location: [facility];  Service: Gynecology;  Laterality: N/A;   WISDOM TOOTH EXTRACTION      Social History   Socioeconomic History   Marital status: Divorced    Spouse name: Not on file   Number of children: 2   Years of education: Not on file   Highest education level: Not on file  Occupational History   Not on file  Tobacco Use   Smoking status: Every Day    Types: Cigarettes   Smokeless tobacco: Never  Vaping Use   Vaping status: Never Used  Substance and Sexual Activity   Alcohol use: Never   Drug use: Never   Sexual activity: Yes    Birth control/protection: Pill  Other Topics Concern   Not on file  Social History Narrative   Not on file    Social Drivers of Health   Financial Resource Strain: Not on file  Food Insecurity: Not on file  Transportation Needs: Not on file  Physical Activity: Not on file  Stress: Not on file  Social Connections: Not on file  Intimate Partner Violence: Not on file    Family History  Problem Relation Age of Onset   Breast cancer Neg Hx    Ovarian cancer Neg Hx     No Known Allergies  Outpatient Medications Prior to Visit  Medication Sig   norethindrone  (MICRONOR ) 0.35 MG tablet Take 1 tablet (0.35 mg total) by mouth daily.   Semaglutide -Weight Management (WEGOVY ) 0.25 MG/0.5ML SOAJ Inject 0.25 mg into the skin once a week.   Vitamin D , Ergocalciferol , (DRISDOL ) 1.25 MG (50000 UNIT) CAPS capsule Take 1 capsule (50,000 Units total) by mouth every 7 (seven) days.   No facility-administered medications prior to visit.    Review of Systems  Constitutional: Negative.   HENT: Negative.    Eyes: Negative.   Respiratory: Negative.  Negative for shortness of breath.   Cardiovascular: Negative.  Negative for chest pain.  Gastrointestinal: Negative.  Negative for abdominal pain, constipation and diarrhea.  Genitourinary: Negative.   Musculoskeletal:  Negative for joint pain and myalgias.  Skin: Negative.   Neurological: Negative.  Negative for dizziness and headaches.  Endo/Heme/Allergies: Negative.  All other systems reviewed and are negative.      Objective:   BP 117/82   Pulse 100   Ht 5\' 9"  (1.753 m)   Wt 203 lb 3.2 oz (92.2 kg)   SpO2 94%   BMI 30.01 kg/m   Vitals:   07/06/23 0929  BP: 117/82  Pulse: 100  Height: 5\' 9"  (1.753 m)  Weight: 203 lb 3.2 oz (92.2 kg)  SpO2: 94%  BMI (Calculated): 29.99    Physical Exam Vitals and nursing note reviewed.  Constitutional:      Appearance: Normal appearance. She is normal weight.  HENT:     Head: Normocephalic and atraumatic.     Nose: Nose normal.     Mouth/Throat:     Mouth: Mucous membranes are moist.  Eyes:      Extraocular Movements: Extraocular movements intact.     Conjunctiva/sclera: Conjunctivae normal.     Pupils: Pupils are equal, round, and reactive to light.  Cardiovascular:     Rate and Rhythm: Normal rate and regular rhythm.     Pulses: Normal pulses.     Heart sounds: Normal heart sounds.  Pulmonary:     Effort: Pulmonary effort is normal.     Breath sounds: Normal breath sounds.  Abdominal:     General: Abdomen is flat. Bowel sounds are normal.     Palpations: Abdomen is soft.  Musculoskeletal:        General: Normal range of motion.     Cervical back: Normal range of motion.  Skin:    General: Skin is warm and dry.  Neurological:     General: No focal deficit present.     Mental Status: She is alert and oriented to person, place, and time.  Psychiatric:        Mood and Affect: Mood normal.        Behavior: Behavior normal.        Thought Content: Thought content normal.        Judgment: Judgment normal.      No results found for any visits on 07/06/23.  Recent Results (from the past 2160 hours)  Cologuard     Status: None   Collection Time: 05/02/23  9:10 AM  Result Value Ref Range   COLOGUARD Negative Negative    Comment: NEGATIVE TEST RESULT. A negative Cologuard result indicates a low likelihood that a colorectal cancer (CRC) or advanced adenoma (adenomatous polyps with more advanced pre-malignant features)  is present. The chance that a person with a negative Cologuard test has a colorectal cancer is less than 1 in 1500 (negative predictive value >99.9%) or has an  advanced adenoma is less than  5.3% (negative predictive value 94.7%). These data are based on a prospective cross-sectional study of 10,000 individuals at average risk for colorectal cancer who were screened with both Cologuard and colonoscopy. (Imperiale T. et al, N Engl J Med 2014;370(14):1286-1297) The normal value (reference range) for this assay is negative.  COLOGUARD RE-SCREENING RECOMMENDATION:  Periodic colorectal cancer screening is an important part of preventive healthcare for asymptomatic individuals at average risk for colorectal cancer.  Following a negative Cologuard result, the American Cancer Society and U.S.  Multi-Society Task Force screening guidelines recommend a Cologuard re-screening interval of 3 years.  References: American Cancer Society Guideline for Colorectal Cancer Screening: https://www.cancer.org/cancer/colon-rectal-cancer/detection-diagnosis-staging/acs-recommendations.html.; Rex DK, Boland CR, Dominitz JK, Colorectal Cancer Screening: Recommendations for Physicians and Patients from the U.S. Multi-Society Task Force on Colorectal Cancer Screening , Am J  Gastroenterology 2017; 112:1016-1030.  TEST DESCRIPTION: Composite algorithmic analysis of stool DNA-biomarkers with hemoglobin immunoassay.   Quantitative values of individual biomarkers are not reportable and are not associated with individual biomarker result reference ranges. Cologuard is intended for colorectal cancer screening of adults of either sex, 45 years or older, who are at average-risk for colorectal cancer (CRC). Cologuard has been approved for use by the U.S. FDA. The performance of Cologuard was  established in a cross sectional study of average-risk adults aged 65-84. Cologuard performance in patients ages 89 to 87 years was estimated by sub-group analysis of near-age groups. Colonoscopies performed for a positive result may find as the most clinically significant lesion: colorectal cancer [4.0%], advanced adenoma (including sessile serrated polyps greater than or equal to 1cm diameter) [20%] or non- advanced adenoma [31%]; or no colorectal neoplasia [45%]. These estimates are derived from a prospective cross-sectional screening study of 10,000 individuals at average risk for colorectal cancer who were screened with both Cologuard and colonoscopy. (Imperiale T. et al, Ole [place] J Med 2014;370(14):1286-1297.)  Cologuard may produce a false negative or false positive result (no colorectal cancer or precancerous polyp present at colonoscopy follow up). A negative Cologuard test result does not guarantee the absence of CRC or advanced adenoma (pre-cancer). The current Cologuard  screening interval is every 3 years. Science writer and [place]. Therapist, music). Cologuard performance data in a 10,000 patient pivotal study using colonoscopy as the reference method can be accessed at the following location: www.exactlabs.com/results. Additional description of the Cologuard test process, warnings and precautions can be found at www.cologuard.com.   TSH     Status: None   Collection Time: 05/24/23 11:08 AM  Result Value Ref Range   TSH 0.502 0.450 - 4.500 uIU/mL  VITAMIN D  25 Hydroxy (Vit-D Deficiency, Fractures)     Status: Abnormal   Collection Time: 05/24/23 11:08 AM  Result Value Ref Range   Vit D, 25-Hydroxy 21.8 (L) 30.0 - 100.0 ng/mL    Comment: Vitamin D  deficiency has been defined by the Institute of Medicine and an Endocrine Society practice guideline as a level of serum 25-OH vitamin D  less than 20 ng/mL (1,2). The Endocrine Society went on to further define vitamin D  insufficiency as a level between 21 and 29 ng/mL (2). 1. IOM (Institute of Medicine). 2010. Dietary reference    intakes for calcium and D. [place]: The    [employer]. 2. Holick MF, [place], Bischoff-Ferrari HA, et al.    Evaluation, treatment, and prevention of vitamin D     deficiency: an Endocrine Society clinical practice    guideline. JCEM. 2011 Jul; 96(7):1911-30.       Assessment & Plan:  Continue same medications.   Problem List Items Addressed This Visit       Other   Weight loss counseling, encounter for   Class 1 obesity due to excess calories without serious comorbidity with body mass index (BMI) of 30.0 to 30.9 in adult   Vitamin D  deficiency - Primary    Return in  about 3 months (around 10/06/2023).   Total time spent: 25 minutes  Google, NP  07/06/2023   This document may have been prepared by Dragon Voice Recognition software and as such may include unintentional dictation errors.

## 2023-07-19 IMAGING — US US PELVIS COMPLETE WITH TRANSVAGINAL
1 series · 13 of 25 positions shown · non-contrast
Comparison: None available.

CLINICAL DATA: Initial evaluation for abnormal uterine bleeding.
IUD placement.

EXAM:
TRANSABDOMINAL AND TRANSVAGINAL ULTRASOUND OF PELVIS
TECHNIQUE: Both transabdominal and transvaginal ultrasound examinations of the
pelvis were performed. Transabdominal technique was performed for
global imaging of the pelvis including uterus, ovaries, adnexal
regions, and pelvic cul-de-sac. It was necessary to proceed with
endovaginal exam following the transabdominal exam to visualize the
endometrium and ovaries.

[Series 1: us pelvic complete with transvaginal · 13 of 65 slices shown]
[im 1/65]
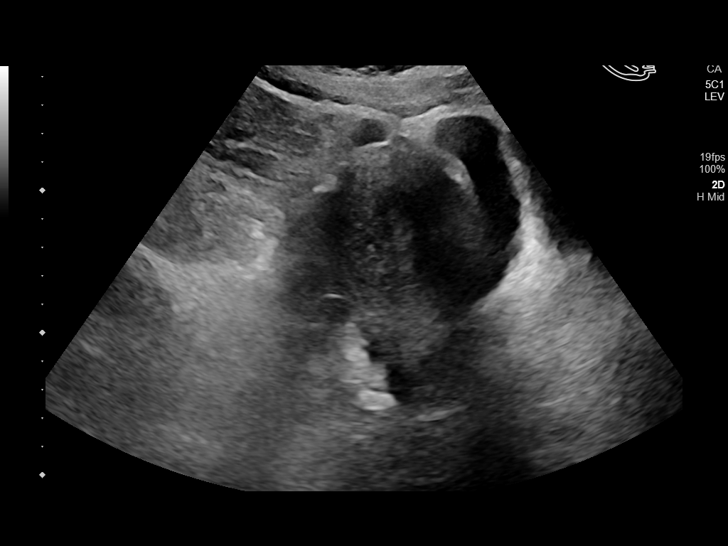
[im 6/65]
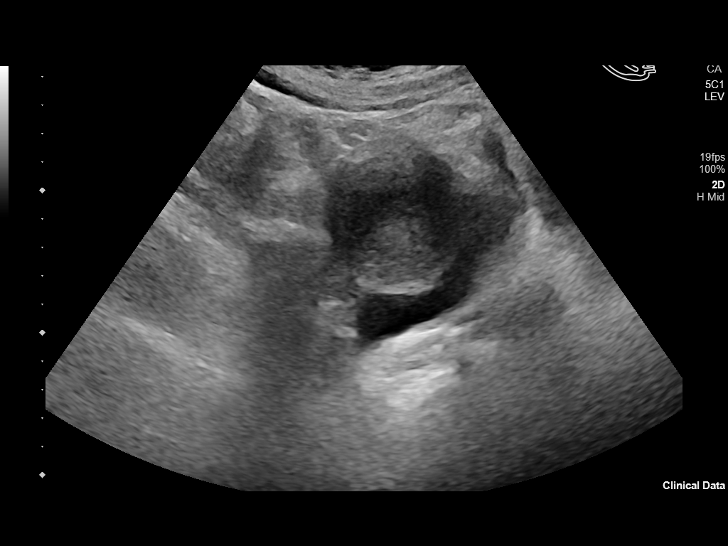
[im 11/65]
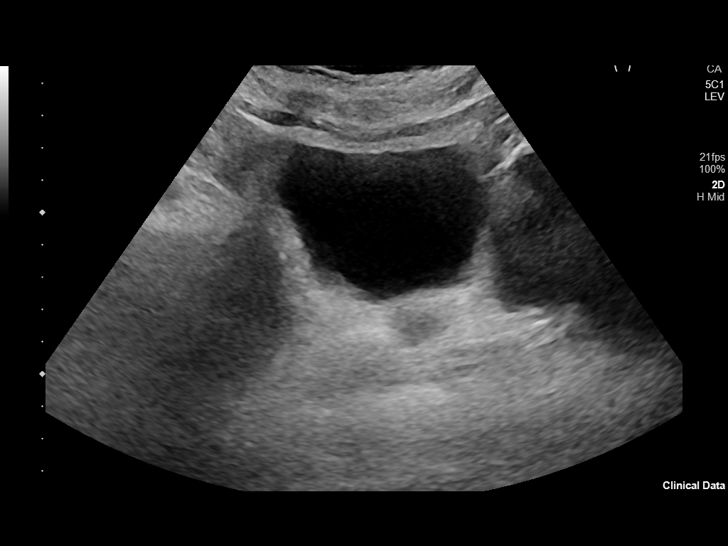
[im 17/65]
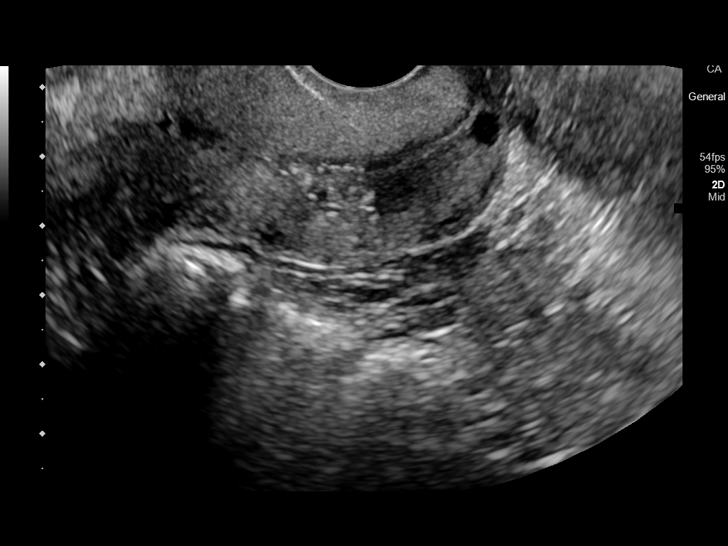
[im 22/65]
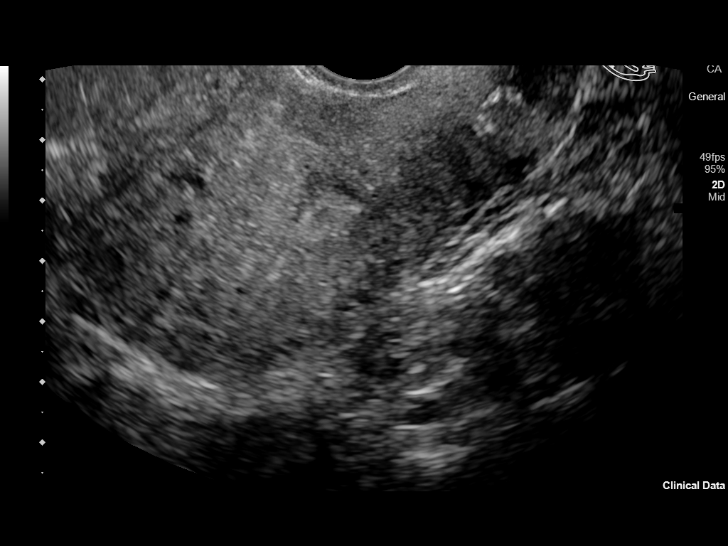
[im 27/65]
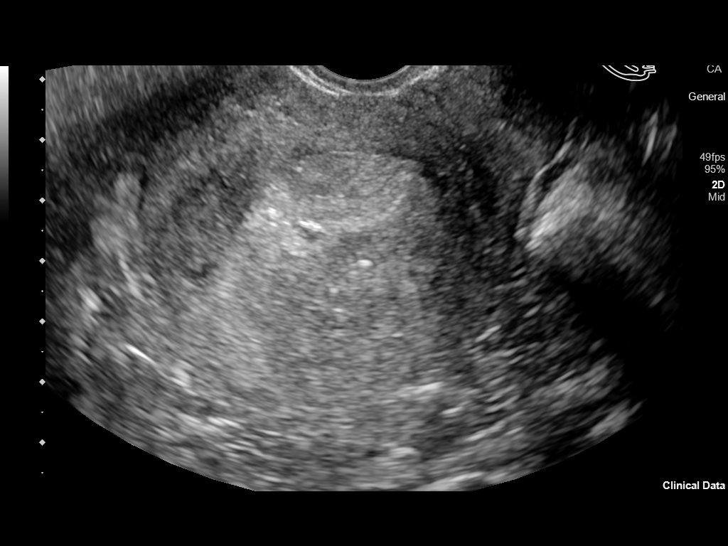
[im 33/65]
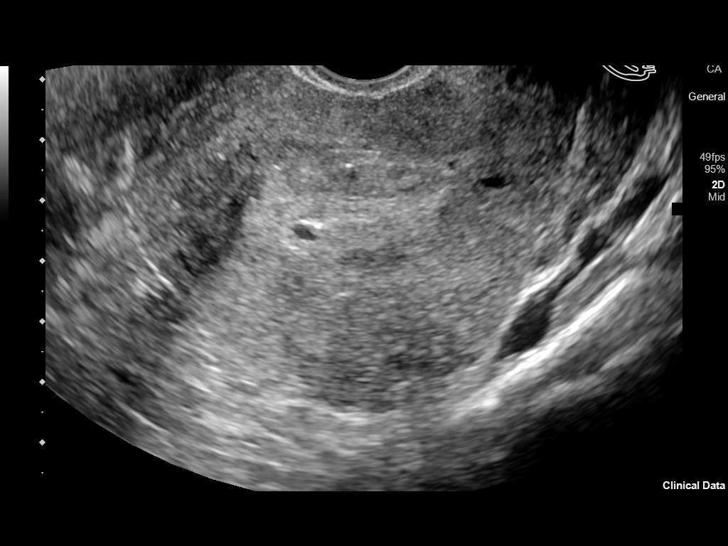
[im 38/65]
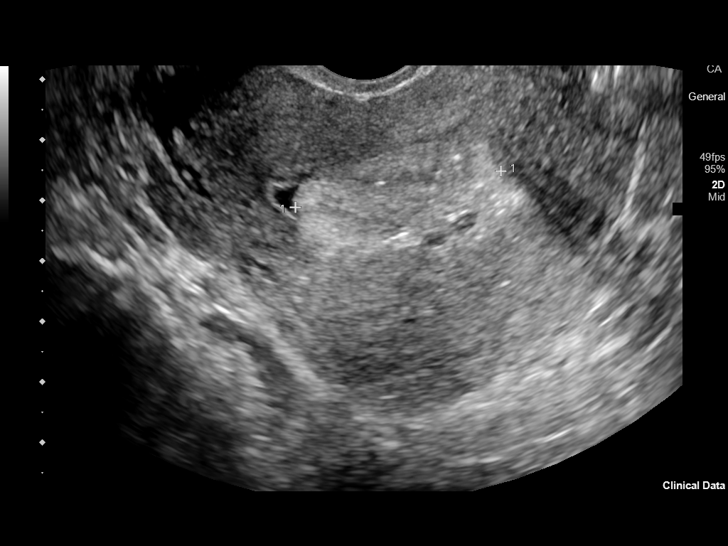
[im 43/65]
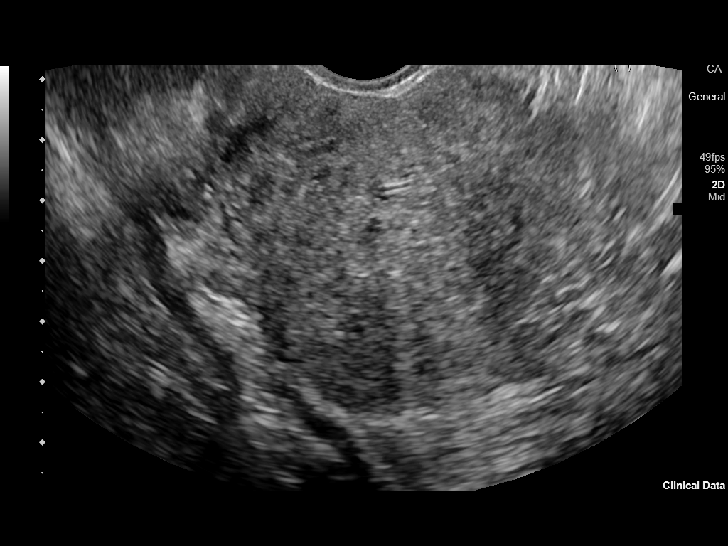
[im 49/65]
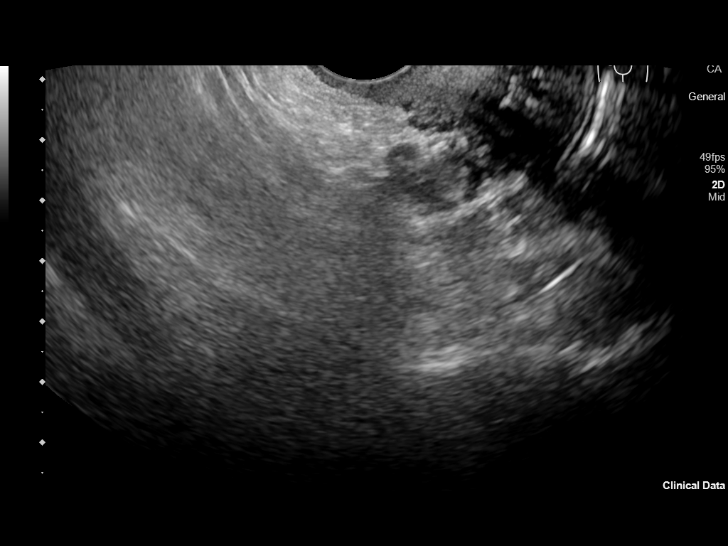
[im 54/65]
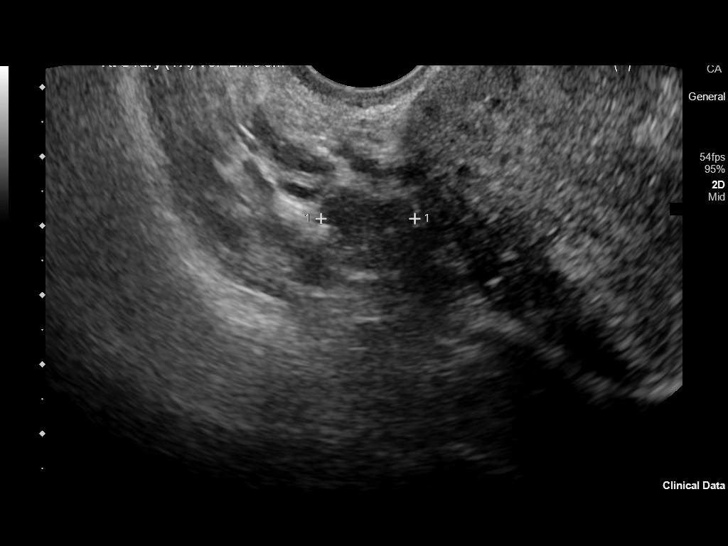
[im 59/65]
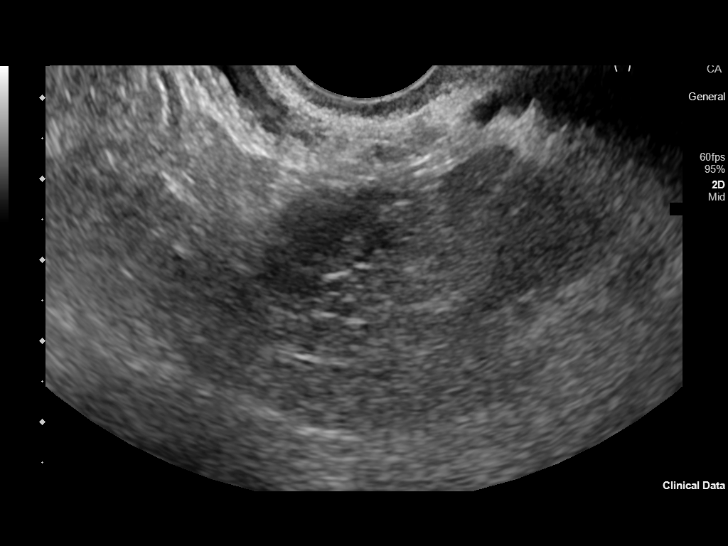
[im 65/65]
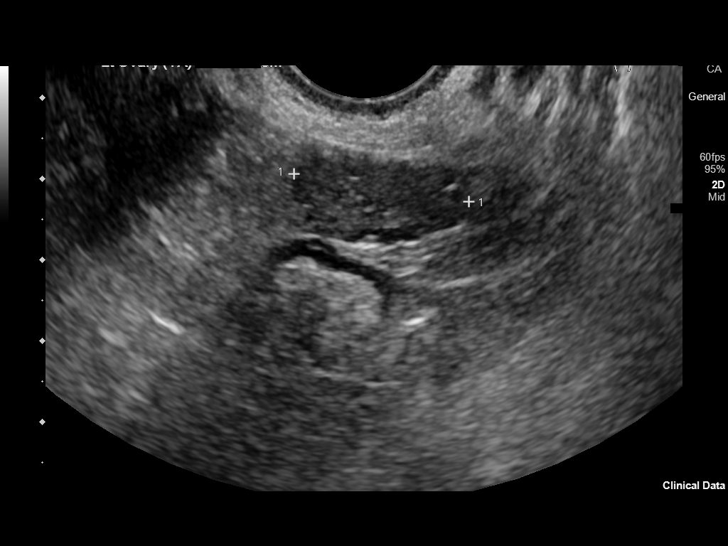

[13 of 25 positions shown; findings below may reference images not displayed]

FINDINGS: Uterus

Measurements: 9.4 x 5.7 x 6.3 cm = volume: 178.4 mL. Uterus is
anteverted. Heterogeneous echotexture seen throughout the uterine
myometrium with a few scattered subcentimeter myometrial cyst, which
could reflect adenomyosis. No discrete fibroid.

Endometrium

Thickened up to 19.3 mm. There is a superimposed polypoid echogenic
lesion measuring 2.2 x 1.3 x 3.5 cm (image 35). Suggestion of
associated vascularity (image 36). Trace simple fluid noted within
the adjacent endometrial cavity. No IUD is seen within the
endometrial cavity.

Right ovary

Measurements: 2.9 x 1.3 x 1.4 cm = volume: 2.8 mL. Normal
appearance/no adnexal mass.

Left ovary

Measurements: 2.8 x 1.2 x 2.2 cm = volume: 3.9 mL. Normal
appearance/no adnexal mass.

Other findings

No abnormal free fluid.
IMPRESSION: 1. Abnormally thickened endometrium measuring up to 19.3 mm, with
superimposed 2.2 x 1.3 x 3.5 cm polypoid echogenic lesion. Finding
raises the possibility for an endometrial polyp or other mass.
Consider further evaluation with sonohysterogram for confirmation
prior to hysteroscopy. Endometrial sampling should also be
considered if patient is at high risk for endometrial carcinoma.
(Ref: Radiological Reasoning: Algorithmic Workup of Abnormal Vaginal
Bleeding with Endovaginal Sonography and Sonohysterography. AJR
8335; 191:S68-73).
2. No IUD seen within the endometrial cavity.
3. Suspected underlying uterine adenomyosis.  No discrete fibroids.
4. Normal sonographic appearance of the ovaries. No adnexal mass or
free fluid.

## 2023-07-28 ENCOUNTER — Encounter: Payer: Self-pay | Admitting: Cardiology

## 2023-07-28 ENCOUNTER — Other Ambulatory Visit: Payer: Self-pay | Admitting: Cardiology

## 2023-07-28 MED ORDER — WEGOVY 0.25 MG/0.5ML ~~LOC~~ SOAJ: 0.5000 mg | SUBCUTANEOUS | 3 refills | Status: DC

## 2023-08-02 IMAGING — CR DG ABDOMEN 1V
1 series · 2 of 2 positions shown · non-contrast
Comparison: None Available.

CLINICAL DATA: Missing IUD

EXAM:
ABDOMEN - 1 VIEW

[Series 1: dg abd 1 view · 0.14mm/px · 2 of 2 slices shown]
[im 1/2]
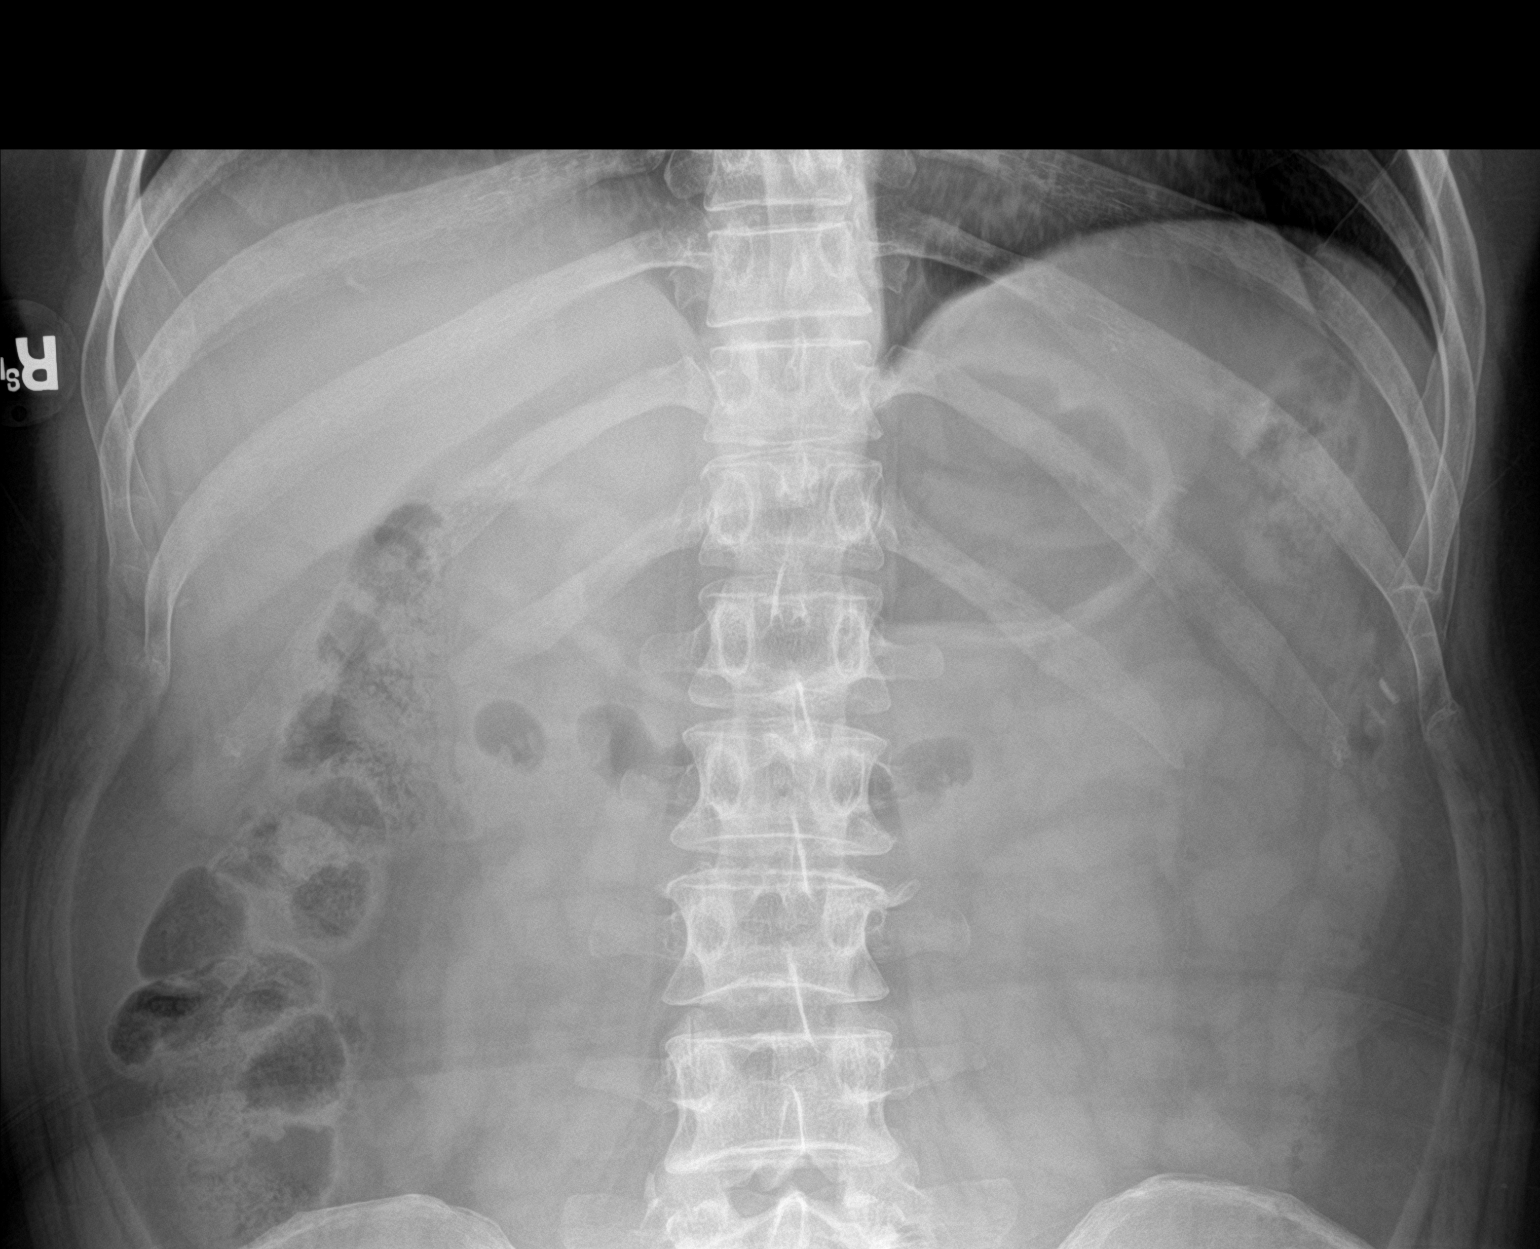
[im 2/2]
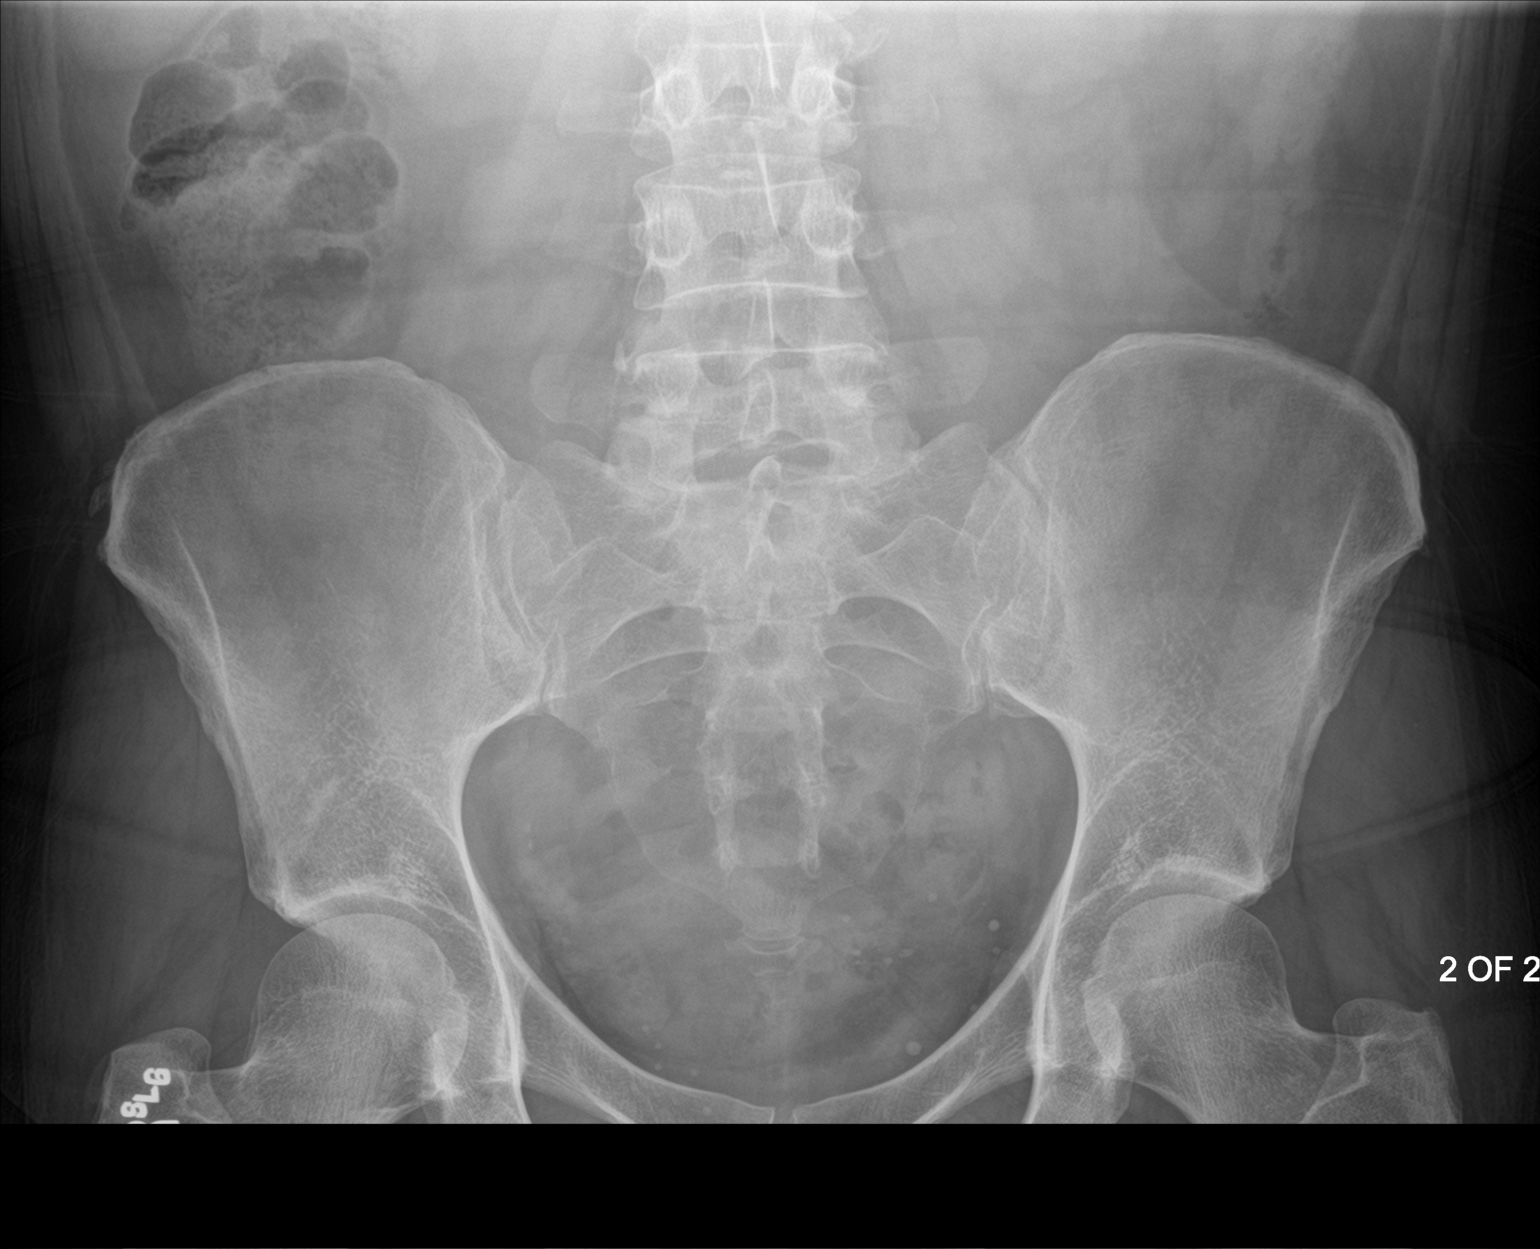

[2 of 2 positions shown; findings below may reference images not displayed]

FINDINGS: No IUD identified.

The bowel gas pattern is normal. No radio-opaque calculi or other
significant radiographic abnormality are seen.
IMPRESSION: No IUD identified.

## 2023-08-04 ENCOUNTER — Other Ambulatory Visit: Payer: Self-pay

## 2023-08-04 MED ORDER — WEGOVY 0.5 MG/0.5ML ~~LOC~~ SOAJ: 0.5000 mg | SUBCUTANEOUS | 0 refills | Status: DC

## 2023-09-24 ENCOUNTER — Other Ambulatory Visit: Payer: Self-pay | Admitting: Cardiology

## 2023-10-06 ENCOUNTER — Encounter: Payer: Self-pay | Admitting: Cardiology

## 2023-10-06 ENCOUNTER — Ambulatory Visit: Admitting: Cardiology

## 2023-10-06 VITALS — BP 136/104 | HR 92 | Ht 69.0 in | Wt 190.4 lb

## 2023-10-06 DIAGNOSIS — E559 Vitamin D deficiency, unspecified: Secondary | ICD-10-CM

## 2023-10-06 DIAGNOSIS — E66811 Obesity, class 1: Secondary | ICD-10-CM

## 2023-10-06 DIAGNOSIS — R7303 Prediabetes: Secondary | ICD-10-CM

## 2023-10-06 DIAGNOSIS — E782 Mixed hyperlipidemia: Secondary | ICD-10-CM

## 2023-10-06 DIAGNOSIS — Z713 Dietary counseling and surveillance: Secondary | ICD-10-CM

## 2023-10-06 DIAGNOSIS — Z0130 Encounter for examination of blood pressure without abnormal findings: Secondary | ICD-10-CM

## 2023-10-06 DIAGNOSIS — E6609 Other obesity due to excess calories: Secondary | ICD-10-CM

## 2023-10-06 DIAGNOSIS — Z6830 Body mass index (BMI) 30.0-30.9, adult: Secondary | ICD-10-CM

## 2023-10-06 DIAGNOSIS — Z1329 Encounter for screening for other suspected endocrine disorder: Secondary | ICD-10-CM

## 2023-10-06 MED ORDER — WEGOVY 1 MG/0.5ML ~~LOC~~ SOAJ: 1.0000 mg | SUBCUTANEOUS | 4 refills | Status: DC

## 2023-10-06 NOTE — Progress Notes (Signed)
 Established Patient Office Visit  Subjective:  Patient ID: Heather Mccann, female    DOB: [birth year]  Age: 48 y.o. MRN: [id number]  Chief Complaint  Patient presents with   Follow-up    3 months follow up    Patient in office for 3 month follow up, did not have blood work done. Patient doing well, no complaints today. Fasting today, will get lab work. Cologuard 04/2023 negative. Did not have mammogram done, patient to call to schedule.  Taking and tolerating Wegovy . Will increase dose to 1 mg weekly.  Continue all other medications.     No other concerns at this time.   Past Medical History:  Diagnosis Date   Abnormal uterine bleeding (AUB)    Ovarian cyst     Past Surgical History:  Procedure Laterality Date   DILATATION AND CURETTAGE/HYSTEROSCOPY WITH MINERVA N/A 09/28/2021   Procedure: DILATATION AND CURETTAGE/HYSTEROSCOPY WITH MINERVA;  Surgeon: Connell Davies, MD;  Location: [facility];  Service: Gynecology;  Laterality: N/A;   DILATION AND CURETTAGE OF UTERUS     POLYPECTOMY N/A 09/28/2021   Procedure: POLYPECTOMY;  Surgeon: Connell Davies, MD;  Location: [facility];  Service: Gynecology;  Laterality: N/A;   WISDOM TOOTH EXTRACTION      Social History   Socioeconomic History   Marital status: Divorced    Spouse name: Not on file   Number of children: 2   Years of education: Not on file   Highest education level: Not on file  Occupational History   Not on file  Tobacco Use   Smoking status: Every Day    Types: Cigarettes   Smokeless tobacco: Never  Vaping Use   Vaping status: Never Used  Substance and Sexual Activity   Alcohol use: Never   Drug use: Never   Sexual activity: Yes    Birth control/protection: Pill  Other Topics Concern   Not on file  Social History Narrative   Not on file   Social Drivers of Health   Financial Resource Strain: Not on file  Food Insecurity: Not on file  Transportation Needs: Not on file  Physical Activity: Not on file   Stress: Not on file  Social Connections: Not on file  Intimate Partner Violence: Not on file    Family History  Problem Relation Age of Onset   Breast cancer Neg Hx    Ovarian cancer Neg Hx     No Known Allergies  Outpatient Medications Prior to Visit  Medication Sig   norethindrone  (MICRONOR ) 0.35 MG tablet Take 1 tablet (0.35 mg total) by mouth daily.   Vitamin D , Ergocalciferol , (DRISDOL ) 1.25 MG (50000 UNIT) CAPS capsule Take 1 capsule (50,000 Units total) by mouth every 7 (seven) days.   [DISCONTINUED] semaglutide -weight management (WEGOVY ) 0.5 MG/0.5ML SOAJ SQ injection INJECT 0.5 MG INTO THE SKIN ONE TIME PER WEEK   No facility-administered medications prior to visit.    Review of Systems  Constitutional: Negative.   HENT: Negative.    Eyes: Negative.   Respiratory: Negative.  Negative for shortness of breath.   Cardiovascular: Negative.  Negative for chest pain.  Gastrointestinal: Negative.  Negative for abdominal pain, constipation and diarrhea.  Genitourinary: Negative.   Musculoskeletal:  Negative for joint pain and myalgias.  Skin: Negative.   Neurological: Negative.  Negative for dizziness and headaches.  Endo/Heme/Allergies: Negative.   All other systems reviewed and are negative.      Objective:   BP (!) 136/104   Pulse 92  Ht 5' 9 (1.753 m)   Wt 190 lb 6.4 oz (86.4 kg)   SpO2 93%   BMI 28.12 kg/m   Vitals:   10/06/23 0932  BP: (!) 136/104  Pulse: 92  Height: 5' 9 (1.753 m)  Weight: 190 lb 6.4 oz (86.4 kg)  SpO2: 93%  BMI (Calculated): 28.1    Physical Exam Vitals and nursing note reviewed.  Constitutional:      Appearance: Normal appearance. She is normal weight.  HENT:     Head: Normocephalic and atraumatic.     Nose: Nose normal.     Mouth/Throat:     Mouth: Mucous membranes are moist.  Eyes:     Extraocular Movements: Extraocular movements intact.     Conjunctiva/sclera: Conjunctivae normal.     Pupils: Pupils are equal,  round, and reactive to light.  Cardiovascular:     Rate and Rhythm: Normal rate and regular rhythm.     Pulses: Normal pulses.     Heart sounds: Normal heart sounds.  Pulmonary:     Effort: Pulmonary effort is normal.     Breath sounds: Normal breath sounds.  Abdominal:     General: Abdomen is flat. Bowel sounds are normal.     Palpations: Abdomen is soft.  Musculoskeletal:        General: Normal range of motion.     Cervical back: Normal range of motion.  Skin:    General: Skin is warm and dry.  Neurological:     General: No focal deficit present.     Mental Status: She is alert and oriented to person, place, and time.  Psychiatric:        Mood and Affect: Mood normal.        Behavior: Behavior normal.        Thought Content: Thought content normal.        Judgment: Judgment normal.      No results found for any visits on 10/06/23.  No results found for this or any previous visit (from the past 2160 hours).    Assessment & Plan:  Fasting lab work today. Schedule mammogram Increase Wegovy  to 1 mg weekly  Problem List Items Addressed This Visit       Other   Prediabetes   Relevant Orders   CMP14+EGFR   Vitamin B12   Hemoglobin A1c   Mixed hyperlipidemia - Primary   Relevant Orders   Lipid Profile   Weight loss counseling, encounter for   Relevant Orders   Vitamin B12   Class 1 obesity due to excess calories without serious comorbidity with body mass index (BMI) of 30.0 to 30.9 in adult   Relevant Medications   semaglutide -weight management (WEGOVY ) 1 MG/0.5ML SOAJ SQ injection   Vitamin D  deficiency   Relevant Orders   Vitamin D  (25 hydroxy)   Vitamin B12   Other Visit Diagnoses       Thyroid disorder screening       Relevant Orders   Vitamin B12   TSH       Return in about 4 months (around 02/05/2024) for fasting labs prior.   Total time spent: 25 minutes  Google, NP  10/06/2023   This document may have been prepared by Dragon  Voice Recognition software and as such may include unintentional dictation errors.

## 2023-10-06 NOTE — Patient Instructions (Signed)
 [facility] [address], [address] [place],  [place]  [address]  Main: [phone]

## 2023-10-07 ENCOUNTER — Ambulatory Visit: Payer: Self-pay | Admitting: Cardiology

## 2023-10-07 LAB — CMP14+EGFR
ALT: 19 IU/L (ref 0–32)
AST: 18 IU/L (ref 0–40)
Albumin: 4.3 g/dL (ref 3.9–4.9)
Alkaline Phosphatase: 54 IU/L (ref 44–121)
BUN/Creatinine Ratio: 17 (ref 9–23)
BUN: 13 mg/dL (ref 6–24)
Bilirubin Total: 0.7 mg/dL (ref 0.0–1.2)
CO2: 22 mmol/L (ref 20–29)
Calcium: 10.1 mg/dL (ref 8.7–10.2)
Chloride: 105 mmol/L (ref 96–106)
Creatinine, Ser: 0.77 mg/dL (ref 0.57–1.00)
Globulin, Total: 2.4 g/dL (ref 1.5–4.5)
Glucose: 99 mg/dL (ref 70–99)
Potassium: 4.6 mmol/L (ref 3.5–5.2)
Sodium: 140 mmol/L (ref 134–144)
Total Protein: 6.7 g/dL (ref 6.0–8.5)
eGFR: 96 mL/min/1.73 (ref >59)

## 2023-10-07 LAB — LIPID PANEL
Chol/HDL Ratio: 4.4 ratio (ref 0.0–4.4)
Cholesterol, Total: 174 mg/dL (ref 100–199)
HDL: 40 mg/dL (ref >39)
LDL Chol Calc (NIH): 110 mg/dL — ABNORMAL HIGH (ref 0–99)
Triglycerides: 135 mg/dL (ref 0–149)
VLDL Cholesterol Cal: 24 mg/dL (ref 5–40)

## 2023-10-07 LAB — HEMOGLOBIN A1C
Est. average glucose Bld gHb Est-mCnc: 108 mg/dL
Hgb A1c MFr Bld: 5.4 % (ref 4.8–5.6)

## 2023-10-07 LAB — TSH: TSH: 0.59 u[IU]/mL (ref 0.450–4.500)

## 2023-10-07 LAB — VITAMIN D 25 HYDROXY (VIT D DEFICIENCY, FRACTURES): Vit D, 25-Hydroxy: 39.4 ng/mL (ref 30.0–100.0)

## 2023-10-07 LAB — VITAMIN B12: Vitamin B-12: 936 pg/mL (ref 232–1245)

## 2024-02-01 ENCOUNTER — Ambulatory Visit: Admitting: Cardiology

## 2024-02-01 ENCOUNTER — Encounter: Payer: Self-pay | Admitting: Cardiology

## 2024-02-01 VITALS — BP 120/84 | HR 88 | Ht 69.0 in | Wt 186.0 lb

## 2024-02-01 DIAGNOSIS — Z1329 Encounter for screening for other suspected endocrine disorder: Secondary | ICD-10-CM | POA: Diagnosis not present

## 2024-02-01 DIAGNOSIS — E782 Mixed hyperlipidemia: Secondary | ICD-10-CM | POA: Diagnosis not present

## 2024-02-01 DIAGNOSIS — Z0130 Encounter for examination of blood pressure without abnormal findings: Secondary | ICD-10-CM

## 2024-02-01 DIAGNOSIS — R7303 Prediabetes: Secondary | ICD-10-CM

## 2024-02-01 DIAGNOSIS — Z3041 Encounter for surveillance of contraceptive pills: Secondary | ICD-10-CM

## 2024-02-01 DIAGNOSIS — E559 Vitamin D deficiency, unspecified: Secondary | ICD-10-CM

## 2024-02-01 MED ORDER — VITAMIN D (ERGOCALCIFEROL) 1.25 MG (50000 UNIT) PO CAPS: 50000.0000 [IU] | ORAL_CAPSULE | ORAL | 11 refills | Status: AC

## 2024-02-01 MED ORDER — NORETHINDRONE 0.35 MG PO TABS: 1.0000 | ORAL_TABLET | Freq: Every day | ORAL | 3 refills | Status: AC

## 2024-02-01 NOTE — Progress Notes (Signed)
 Established Patient Office Visit  Subjective:  Patient ID: Heather Mccann, female    DOB: [birth year]  Age: 48 y.o. MRN: [id number]  Chief Complaint  Patient presents with   Follow-up    4 month follow up    Patient in office for regular follow up, did not have blood work done.  Patient doing well, no complaints today.  Patient due for mammogram, will schedule after the first of the year per patient.  Insurance no longer covering Wegovy . Patient maintaining her weight loss.  Continue current medications.     No other concerns at this time.   Past Medical History:  Diagnosis Date   Abnormal uterine bleeding (AUB)    Ovarian cyst     Past Surgical History:  Procedure Laterality Date   DILATATION AND CURETTAGE/HYSTEROSCOPY WITH MINERVA N/A 09/28/2021   Procedure: DILATATION AND CURETTAGE/HYSTEROSCOPY WITH MINERVA;  Surgeon: Connell Davies, MD;  Location: [facility];  Service: Gynecology;  Laterality: N/A;   DILATION AND CURETTAGE OF UTERUS     POLYPECTOMY N/A 09/28/2021   Procedure: POLYPECTOMY;  Surgeon: Connell Davies, MD;  Location: [facility];  Service: Gynecology;  Laterality: N/A;   WISDOM TOOTH EXTRACTION      Social History   Socioeconomic History   Marital status: Divorced    Spouse name: Not on file   Number of children: 2   Years of education: Not on file   Highest education level: Not on file  Occupational History   Not on file  Tobacco Use   Smoking status: Every Day    Types: Cigarettes   Smokeless tobacco: Never  Vaping Use   Vaping status: Never Used  Substance and Sexual Activity   Alcohol use: Never   Drug use: Never   Sexual activity: Yes    Birth control/protection: Pill  Other Topics Concern   Not on file  Social History Narrative   Not on file   Social Drivers of Health   Tobacco Use: High Risk (02/01/2024)   Patient History    Smoking Tobacco Use: Every Day    Smokeless Tobacco Use: Never    Passive Exposure: Not on file  Financial  Resource Strain: Not on file  Food Insecurity: Not on file  Transportation Needs: Not on file  Physical Activity: Not on file  Stress: Not on file  Social Connections: Not on file  Intimate Partner Violence: Not on file  Depression (EYV7-0): Not on file  Alcohol Screen: Not on file  Housing: Not on file  Utilities: Not on file  Health Literacy: Not on file    Family History  Problem Relation Age of Onset   Breast cancer Neg Hx    Ovarian cancer Neg Hx     Allergies[1]  Show/hide medication list[2]  Review of Systems  Constitutional: Negative.   HENT: Negative.    Eyes: Negative.   Respiratory: Negative.  Negative for shortness of breath.   Cardiovascular: Negative.  Negative for chest pain.  Gastrointestinal: Negative.  Negative for abdominal pain, constipation and diarrhea.  Genitourinary: Negative.   Musculoskeletal:  Negative for joint pain and myalgias.  Skin: Negative.   Neurological: Negative.  Negative for dizziness and headaches.  Endo/Heme/Allergies: Negative.   All other systems reviewed and are negative.       Objective:   BP 120/84   Pulse 88   Ht 5' 9 (1.753 m)   Wt 186 lb (84.4 kg)   SpO2 95%   BMI 27.47 kg/m  Vitals:   02/01/24 0937  BP: 120/84  Pulse: 88  Height: 5' 9 (1.753 m)  Weight: 186 lb (84.4 kg)  SpO2: 95%  BMI (Calculated): 27.45    Physical Exam Vitals and nursing note reviewed.  Constitutional:      Appearance: Normal appearance. She is normal weight.  HENT:     Head: Normocephalic and atraumatic.     Nose: Nose normal.     Mouth/Throat:     Mouth: Mucous membranes are moist.  Eyes:     Extraocular Movements: Extraocular movements intact.     Conjunctiva/sclera: Conjunctivae normal.     Pupils: Pupils are equal, round, and reactive to light.  Cardiovascular:     Rate and Rhythm: Normal rate and regular rhythm.     Pulses: Normal pulses.     Heart sounds: Normal heart sounds.  Pulmonary:     Effort: Pulmonary  effort is normal.     Breath sounds: Normal breath sounds.  Abdominal:     General: Abdomen is flat. Bowel sounds are normal.     Palpations: Abdomen is soft.  Musculoskeletal:        General: Normal range of motion.     Cervical back: Normal range of motion.  Skin:    General: Skin is warm and dry.  Neurological:     General: No focal deficit present.     Mental Status: She is alert and oriented to person, place, and time.  Psychiatric:        Mood and Affect: Mood normal.        Behavior: Behavior normal.        Thought Content: Thought content normal.        Judgment: Judgment normal.      No results found for any visits on 02/01/24.  No results found for this or any previous visit (from the past 2160 hours).    Assessment & Plan:  Lab work today Schedule mammogram Continue current medications  Problem List Items Addressed This Visit       Other   Prediabetes - Primary   Relevant Orders   CMP14+EGFR   Hemoglobin A1c   Mixed hyperlipidemia   Relevant Orders   CMP14+EGFR   Lipid Profile   Vitamin D  deficiency   Relevant Orders   Vitamin D  (25 hydroxy)   Other Visit Diagnoses       Thyroid disorder screening       Relevant Orders   TSH     Encounter for surveillance of contraceptive pills       Relevant Medications   norethindrone  (MICRONOR ) 0.35 MG tablet       Return in about 6 months (around 08/01/2024) for fasitng labs prior.   Total time spent: 25 minutes. This time includes review of previous notes and results and patient face to face interaction during today's visit.    Jeoffrey Pollen, NP  02/01/2024   This document may have been prepared by Dragon Voice Recognition software and as such may include unintentional dictation errors.     [1] No Known Allergies [2]  Outpatient Medications Prior to Visit  Medication Sig   [DISCONTINUED] norethindrone  (MICRONOR ) 0.35 MG tablet Take 1 tablet (0.35 mg total) by mouth daily.   [DISCONTINUED]  Vitamin D , Ergocalciferol , (DRISDOL ) 1.25 MG (50000 UNIT) CAPS capsule Take 1 capsule (50,000 Units total) by mouth every 7 (seven) days.   [DISCONTINUED] semaglutide -weight management (WEGOVY ) 1 MG/0.5ML SOAJ SQ injection Inject 1 mg into the skin once  a week. (Patient not taking: Reported on 02/01/2024)   No facility-administered medications prior to visit.

## 2024-02-02 LAB — CMP14+EGFR
ALT: 18 IU/L (ref 0–32)
AST: 16 IU/L (ref 0–40)
Albumin: 4.1 g/dL (ref 3.9–4.9)
Alkaline Phosphatase: 60 IU/L (ref 41–116)
BUN/Creatinine Ratio: 14 (ref 9–23)
BUN: 10 mg/dL (ref 6–24)
Bilirubin Total: 0.4 mg/dL (ref 0.0–1.2)
CO2: 24 mmol/L (ref 20–29)
Calcium: 9 mg/dL (ref 8.7–10.2)
Chloride: 103 mmol/L (ref 96–106)
Creatinine, Ser: 0.74 mg/dL (ref 0.57–1.00)
Globulin, Total: 2 g/dL (ref 1.5–4.5)
Glucose: 140 mg/dL — ABNORMAL HIGH (ref 70–99)
Potassium: 4.3 mmol/L (ref 3.5–5.2)
Sodium: 140 mmol/L (ref 134–144)
Total Protein: 6.1 g/dL (ref 6.0–8.5)
eGFR: 101 mL/min/1.73 (ref >59)

## 2024-02-02 LAB — LIPID PANEL
Chol/HDL Ratio: 3.7 ratio (ref 0.0–4.4)
Cholesterol, Total: 165 mg/dL (ref 100–199)
HDL: 45 mg/dL (ref >39)
LDL Chol Calc (NIH): 95 mg/dL (ref 0–99)
Triglycerides: 139 mg/dL (ref 0–149)
VLDL Cholesterol Cal: 25 mg/dL (ref 5–40)

## 2024-02-02 LAB — TSH: TSH: 0.752 u[IU]/mL (ref 0.450–4.500)

## 2024-02-02 LAB — VITAMIN D 25 HYDROXY (VIT D DEFICIENCY, FRACTURES): Vit D, 25-Hydroxy: 29.4 ng/mL — ABNORMAL LOW (ref 30.0–100.0)

## 2024-02-02 LAB — HEMOGLOBIN A1C
Est. average glucose Bld gHb Est-mCnc: 105 mg/dL
Hgb A1c MFr Bld: 5.3 % (ref 4.8–5.6)

## 2024-02-06 ENCOUNTER — Ambulatory Visit: Payer: Self-pay | Admitting: Cardiology

## 2024-02-06 NOTE — Progress Notes (Signed)
 Left message to return call

## 2024-02-07 NOTE — Progress Notes (Signed)
 Left message to return call

## 2024-02-19 ENCOUNTER — Encounter: Payer: Self-pay | Admitting: Cardiology

## 2024-02-20 ENCOUNTER — Other Ambulatory Visit: Payer: Self-pay | Admitting: Cardiology

## 2024-02-20 MED ORDER — WEGOVY 0.25 MG/0.5ML ~~LOC~~ SOAJ: 0.2500 mg | SUBCUTANEOUS | 3 refills | Status: AC

## 2024-08-01 ENCOUNTER — Ambulatory Visit: Admitting: Cardiology
